# Patient Record
Sex: Female | Born: 1940 | Race: White | Hispanic: No | Marital: Married | Smoking: Never smoker
Health system: Southern US, Community
[De-identification: ages and names within clinical notes are randomized; demographics above are authoritative.]

## PROBLEM LIST (undated history)

## (undated) DIAGNOSIS — R222 Localized swelling, mass and lump, trunk: Secondary | ICD-10-CM

## (undated) DIAGNOSIS — C801 Malignant (primary) neoplasm, unspecified: Secondary | ICD-10-CM

## (undated) DIAGNOSIS — R112 Nausea with vomiting, unspecified: Secondary | ICD-10-CM

## (undated) DIAGNOSIS — I10 Essential (primary) hypertension: Secondary | ICD-10-CM

## (undated) DIAGNOSIS — E785 Hyperlipidemia, unspecified: Secondary | ICD-10-CM

## (undated) DIAGNOSIS — Z9889 Other specified postprocedural states: Secondary | ICD-10-CM

## (undated) DIAGNOSIS — E039 Hypothyroidism, unspecified: Secondary | ICD-10-CM

## (undated) DIAGNOSIS — D229 Melanocytic nevi, unspecified: Secondary | ICD-10-CM

## (undated) DIAGNOSIS — J302 Other seasonal allergic rhinitis: Secondary | ICD-10-CM

## (undated) HISTORY — PX: TUBAL LIGATION: SHX77

## (undated) HISTORY — DX: Hypothyroidism, unspecified: E03.9

## (undated) HISTORY — DX: Localized swelling, mass and lump, trunk: R22.2

## (undated) HISTORY — DX: Hyperlipidemia, unspecified: E78.5

## (undated) HISTORY — DX: Other seasonal allergic rhinitis: J30.2

## (undated) HISTORY — DX: Essential (primary) hypertension: I10

## (undated) HISTORY — PX: OTHER SURGICAL HISTORY: SHX169

---

## 2006-04-07 ENCOUNTER — Other Ambulatory Visit: Admission: RE | Admit: 2006-04-07 | Discharge: 2006-04-07 | Payer: Self-pay | Admitting: Family Medicine

## 2008-04-19 ENCOUNTER — Emergency Department (HOSPITAL_COMMUNITY): Admission: EM | Admit: 2008-04-19 | Discharge: 2008-04-19 | Payer: Self-pay | Admitting: Emergency Medicine

## 2011-03-25 ENCOUNTER — Other Ambulatory Visit: Payer: Self-pay | Admitting: *Deleted

## 2011-03-25 ENCOUNTER — Encounter: Payer: Self-pay | Admitting: Cardiology

## 2011-03-25 ENCOUNTER — Ambulatory Visit (INDEPENDENT_AMBULATORY_CARE_PROVIDER_SITE_OTHER): Payer: Medicare Other | Admitting: Cardiology

## 2011-03-25 VITALS — BP 128/88 | HR 59 | Ht 62.0 in | Wt 129.6 lb

## 2011-03-25 DIAGNOSIS — E7800 Pure hypercholesterolemia, unspecified: Secondary | ICD-10-CM | POA: Insufficient documentation

## 2011-03-25 DIAGNOSIS — R002 Palpitations: Secondary | ICD-10-CM

## 2011-03-25 DIAGNOSIS — E039 Hypothyroidism, unspecified: Secondary | ICD-10-CM

## 2011-03-25 DIAGNOSIS — I10 Essential (primary) hypertension: Secondary | ICD-10-CM

## 2011-03-25 LAB — COMPREHENSIVE METABOLIC PANEL
AST: 25 U/L (ref 0–37)
Albumin: 4.3 g/dL (ref 3.5–5.2)
Alkaline Phosphatase: 86 U/L (ref 39–117)
BUN: 14 mg/dL (ref 6–23)
Creat: 0.99 mg/dL (ref 0.40–1.20)
Glucose, Bld: 80 mg/dL (ref 70–99)
Potassium: 4.3 mEq/L (ref 3.5–5.3)

## 2011-03-25 LAB — LIPID PANEL
Cholesterol: 232 mg/dL — ABNORMAL HIGH (ref 0–200)
HDL: 56 mg/dL
LDL Cholesterol: 149 mg/dL — ABNORMAL HIGH (ref 0–99)
Total CHOL/HDL Ratio: 4.1 ratio
Triglycerides: 133 mg/dL
VLDL: 27 mg/dL (ref 0–40)

## 2011-03-25 MED ORDER — LISINOPRIL 5 MG PO TABS: 5.0000 mg | ORAL_TABLET | Freq: Every day | ORAL | Status: DC

## 2011-03-25 NOTE — Progress Notes (Signed)
Subjective:   Olivia Espinoza is seen today for followup one-year visit. She has occasional palpitations mainly at night her last less than a minute. She's been also simvastatin caused generalized myalgias. She's had hypothyroidism treated and feels considerably better. Overall, she feels better since she retired in February 2011. She doesn't note any palpitations during the day.she's not had any syncope, lightheadedness or dizziness.  No current outpatient prescriptions on file.    Allergies  Allergen Reactions  . Advil Hives  . Ceclor (Cefaclor)   . Motrin (Ibuprofen) Hives    There is no problem list on file for this patient.   History  Smoking status  . Never Smoker   Smokeless tobacco  . Not on file    History  Alcohol Use  . Yes    occassionally    No family history on file.  Review of Systems:   The patient denies any heat or cold intolerance.  No weight gain or weight loss.  The patient denies headaches or blurry vision.  There is no cough or sputum production.  The patient denies dizziness.  There is no hematuria or hematochezia.  The patient denies any muscle aches or arthritis.  The patient denies any rash.  The patient denies frequent falling or instability.  There is no history of depression or anxiety.  All other systems were reviewed and are negative.   Physical Exam:   Weight is 129. Blood pressure as recorded. The head is normocephalic and atraumatic.  Pupils are equally round and reactive to light.  Sclerae nonicteric.  Conjunctiva is clear.  Oropharynx is unremarkable.  There's adequate oral airway.  Neck is supple there are no masses.  Thyroid is not enlarged.  There is no lymphadenopathy.  Lungs are clear.  Chest is symmetric.  Heart shows a regular rate and rhythm.  S1 and S2 are normal.  There is no murmur click or gallop.  Abdomen is soft normal bowel sounds.  There is no organomegaly.  Genital and rectal deferred.  Extremities are without edema.  Peripheral  pulses are adequate.  Neurologically intact.  Full range of motion.  The patient is not depressed.  Skin is warm and dry.   Assessment / Plan:

## 2011-03-25 NOTE — Assessment & Plan Note (Signed)
I doubt this is a real serious arrhythmia. We'll get a Holter monitor. If she is having arrhythmia during the day or worsening arrhythmia than what I suspect, will consider further evaluation. She did have a stress echo a few years ago the documented normal left ventricular function and I don't feel the need to repeat it at this time.

## 2011-03-25 NOTE — Assessment & Plan Note (Signed)
On replaced

## 2011-03-26 ENCOUNTER — Telehealth: Payer: Self-pay | Admitting: *Deleted

## 2011-03-26 NOTE — Telephone Encounter (Signed)
Pt notified of lab results and pt will start Crestor 5 mg 2-3 times weekly.  Pt will have labs in six weeks and will call if any problems develop with low dose Crestor.  Sample provided to pt of Crestor.

## 2011-03-26 NOTE — Telephone Encounter (Signed)
Message copied by Barnetta Hammersmith on Tue Mar 26, 2011 10:02 AM ------      Message from: Norma Fredrickson      Created: Tue Mar 26, 2011  8:30 AM       Does not look like she is on any statin. Would she consider low dose Crestor 5mg  2 to 3 x per week. If so, recheck labs in 6 weeks.

## 2011-04-22 ENCOUNTER — Encounter: Payer: Self-pay | Admitting: Cardiology

## 2011-04-24 ENCOUNTER — Other Ambulatory Visit: Payer: Self-pay | Admitting: *Deleted

## 2011-04-24 DIAGNOSIS — E7800 Pure hypercholesterolemia, unspecified: Secondary | ICD-10-CM

## 2011-05-07 ENCOUNTER — Other Ambulatory Visit: Payer: Self-pay | Admitting: *Deleted

## 2011-09-24 LAB — POCT CARDIAC MARKERS
CKMB, poc: 1.1
Operator id: 277751
Troponin i, poc: <0.05
Troponin i, poc: <0.05

## 2011-09-24 LAB — POCT I-STAT, CHEM 8
BUN: 25 — ABNORMAL HIGH
Calcium, Ion: 1.14
Chloride: 102
Creatinine, Ser: 1.6 — ABNORMAL HIGH
Sodium: 136

## 2011-09-24 LAB — D-DIMER, QUANTITATIVE: D-Dimer, Quant: 0.29

## 2012-04-01 ENCOUNTER — Other Ambulatory Visit: Payer: Self-pay | Admitting: Cardiology

## 2014-02-08 ENCOUNTER — Other Ambulatory Visit: Payer: Self-pay | Admitting: Family Medicine

## 2014-02-08 DIAGNOSIS — R1031 Right lower quadrant pain: Secondary | ICD-10-CM

## 2014-02-11 ENCOUNTER — Ambulatory Visit
Admission: RE | Admit: 2014-02-11 | Discharge: 2014-02-11 | Disposition: A | Discharge status: Home / Self Care | Payer: Medicare PPO | Source: Ambulatory Visit | Attending: Family Medicine | Admitting: Family Medicine

## 2014-02-11 DIAGNOSIS — R1031 Right lower quadrant pain: Secondary | ICD-10-CM

## 2014-02-11 MED ORDER — IOHEXOL 300 MG/ML  SOLN
100.0000 mL | Freq: Once | INTRAMUSCULAR | Status: AC | PRN
  Administered 2014-02-11: 100 mL via INTRAVENOUS

## 2014-05-09 ENCOUNTER — Encounter: Payer: Self-pay | Admitting: Cardiology

## 2014-08-05 DIAGNOSIS — H25813 Combined forms of age-related cataract, bilateral: Secondary | ICD-10-CM | POA: Insufficient documentation

## 2014-08-05 DIAGNOSIS — IMO0002 Reserved for concepts with insufficient information to code with codable children: Secondary | ICD-10-CM | POA: Insufficient documentation

## 2015-05-16 DIAGNOSIS — H2513 Age-related nuclear cataract, bilateral: Secondary | ICD-10-CM | POA: Diagnosis not present

## 2015-05-16 DIAGNOSIS — D3132 Benign neoplasm of left choroid: Secondary | ICD-10-CM | POA: Diagnosis not present

## 2015-06-22 DIAGNOSIS — Z1231 Encounter for screening mammogram for malignant neoplasm of breast: Secondary | ICD-10-CM | POA: Diagnosis not present

## 2015-07-10 DIAGNOSIS — L237 Allergic contact dermatitis due to plants, except food: Secondary | ICD-10-CM | POA: Diagnosis not present

## 2015-08-18 DIAGNOSIS — L570 Actinic keratosis: Secondary | ICD-10-CM | POA: Diagnosis not present

## 2015-08-18 DIAGNOSIS — X32XXXD Exposure to sunlight, subsequent encounter: Secondary | ICD-10-CM | POA: Diagnosis not present

## 2015-08-18 DIAGNOSIS — Z1283 Encounter for screening for malignant neoplasm of skin: Secondary | ICD-10-CM | POA: Diagnosis not present

## 2015-08-24 DIAGNOSIS — J309 Allergic rhinitis, unspecified: Secondary | ICD-10-CM | POA: Diagnosis not present

## 2015-08-24 DIAGNOSIS — J320 Chronic maxillary sinusitis: Secondary | ICD-10-CM | POA: Diagnosis not present

## 2015-09-06 DIAGNOSIS — H52223 Regular astigmatism, bilateral: Secondary | ICD-10-CM | POA: Diagnosis not present

## 2015-09-06 DIAGNOSIS — D3132 Benign neoplasm of left choroid: Secondary | ICD-10-CM | POA: Diagnosis not present

## 2015-09-06 DIAGNOSIS — H524 Presbyopia: Secondary | ICD-10-CM | POA: Diagnosis not present

## 2015-09-06 DIAGNOSIS — H5203 Hypermetropia, bilateral: Secondary | ICD-10-CM | POA: Diagnosis not present

## 2015-09-06 DIAGNOSIS — H40053 Ocular hypertension, bilateral: Secondary | ICD-10-CM | POA: Diagnosis not present

## 2015-09-06 DIAGNOSIS — H2513 Age-related nuclear cataract, bilateral: Secondary | ICD-10-CM | POA: Diagnosis not present

## 2015-10-27 DIAGNOSIS — Z23 Encounter for immunization: Secondary | ICD-10-CM | POA: Diagnosis not present

## 2015-11-07 DIAGNOSIS — R1011 Right upper quadrant pain: Secondary | ICD-10-CM | POA: Diagnosis not present

## 2015-11-07 DIAGNOSIS — Z1389 Encounter for screening for other disorder: Secondary | ICD-10-CM | POA: Diagnosis not present

## 2015-11-07 DIAGNOSIS — I10 Essential (primary) hypertension: Secondary | ICD-10-CM | POA: Diagnosis not present

## 2015-11-07 DIAGNOSIS — E039 Hypothyroidism, unspecified: Secondary | ICD-10-CM | POA: Diagnosis not present

## 2015-11-07 DIAGNOSIS — Z6824 Body mass index (BMI) 24.0-24.9, adult: Secondary | ICD-10-CM | POA: Diagnosis not present

## 2015-11-07 DIAGNOSIS — E784 Other hyperlipidemia: Secondary | ICD-10-CM | POA: Diagnosis not present

## 2015-11-15 DIAGNOSIS — C6932 Malignant neoplasm of left choroid: Secondary | ICD-10-CM | POA: Diagnosis not present

## 2015-11-15 DIAGNOSIS — H2513 Age-related nuclear cataract, bilateral: Secondary | ICD-10-CM | POA: Diagnosis not present

## 2015-11-20 DIAGNOSIS — I10 Essential (primary) hypertension: Secondary | ICD-10-CM | POA: Diagnosis not present

## 2015-11-20 DIAGNOSIS — E7800 Pure hypercholesterolemia, unspecified: Secondary | ICD-10-CM | POA: Diagnosis not present

## 2015-11-20 DIAGNOSIS — C6932 Malignant neoplasm of left choroid: Secondary | ICD-10-CM | POA: Diagnosis not present

## 2015-11-20 DIAGNOSIS — Z79899 Other long term (current) drug therapy: Secondary | ICD-10-CM | POA: Diagnosis not present

## 2015-11-20 DIAGNOSIS — Z886 Allergy status to analgesic agent status: Secondary | ICD-10-CM | POA: Diagnosis not present

## 2015-11-20 DIAGNOSIS — E039 Hypothyroidism, unspecified: Secondary | ICD-10-CM | POA: Diagnosis not present

## 2015-11-20 DIAGNOSIS — Z85820 Personal history of malignant melanoma of skin: Secondary | ICD-10-CM | POA: Diagnosis not present

## 2015-11-20 DIAGNOSIS — Z888 Allergy status to other drugs, medicaments and biological substances status: Secondary | ICD-10-CM | POA: Diagnosis not present

## 2015-11-21 ENCOUNTER — Other Ambulatory Visit (HOSPITAL_COMMUNITY): Payer: Self-pay | Admitting: Endocrinology

## 2015-11-21 DIAGNOSIS — C6932 Malignant neoplasm of left choroid: Secondary | ICD-10-CM

## 2015-11-27 DIAGNOSIS — I491 Atrial premature depolarization: Secondary | ICD-10-CM | POA: Diagnosis not present

## 2015-11-27 DIAGNOSIS — C6930 Malignant neoplasm of unspecified choroid: Secondary | ICD-10-CM | POA: Diagnosis not present

## 2015-11-28 DIAGNOSIS — R222 Localized swelling, mass and lump, trunk: Secondary | ICD-10-CM | POA: Diagnosis not present

## 2015-11-28 DIAGNOSIS — C6930 Malignant neoplasm of unspecified choroid: Secondary | ICD-10-CM | POA: Diagnosis not present

## 2015-11-28 DIAGNOSIS — C6990 Malignant neoplasm of unspecified site of unspecified eye: Secondary | ICD-10-CM | POA: Diagnosis not present

## 2015-11-28 DIAGNOSIS — J984 Other disorders of lung: Secondary | ICD-10-CM | POA: Diagnosis not present

## 2015-12-04 DIAGNOSIS — C6932 Malignant neoplasm of left choroid: Secondary | ICD-10-CM | POA: Diagnosis not present

## 2015-12-04 DIAGNOSIS — I160 Hypertensive urgency: Secondary | ICD-10-CM | POA: Diagnosis not present

## 2015-12-04 DIAGNOSIS — E039 Hypothyroidism, unspecified: Secondary | ICD-10-CM | POA: Diagnosis not present

## 2015-12-04 DIAGNOSIS — I10 Essential (primary) hypertension: Secondary | ICD-10-CM | POA: Diagnosis not present

## 2015-12-04 DIAGNOSIS — C6930 Malignant neoplasm of unspecified choroid: Secondary | ICD-10-CM | POA: Diagnosis not present

## 2015-12-04 DIAGNOSIS — E7800 Pure hypercholesterolemia, unspecified: Secondary | ICD-10-CM | POA: Diagnosis not present

## 2015-12-04 DIAGNOSIS — Z8249 Family history of ischemic heart disease and other diseases of the circulatory system: Secondary | ICD-10-CM | POA: Diagnosis not present

## 2015-12-04 DIAGNOSIS — E785 Hyperlipidemia, unspecified: Secondary | ICD-10-CM | POA: Diagnosis not present

## 2015-12-04 DIAGNOSIS — Z823 Family history of stroke: Secondary | ICD-10-CM | POA: Diagnosis not present

## 2015-12-12 ENCOUNTER — Other Ambulatory Visit (HOSPITAL_COMMUNITY): Payer: Self-pay | Admitting: Endocrinology

## 2015-12-14 DIAGNOSIS — C6930 Malignant neoplasm of unspecified choroid: Secondary | ICD-10-CM | POA: Insufficient documentation

## 2016-01-15 ENCOUNTER — Other Ambulatory Visit (HOSPITAL_COMMUNITY): Payer: Self-pay | Admitting: Interventional Radiology

## 2016-01-15 ENCOUNTER — Inpatient Hospital Stay
Admission: RE | Admit: 2016-01-15 | Discharge: 2016-01-15 | Disposition: A | Discharge status: Home / Self Care | Payer: Self-pay | Source: Ambulatory Visit | Attending: Interventional Radiology | Admitting: Interventional Radiology

## 2016-01-15 DIAGNOSIS — C801 Malignant (primary) neoplasm, unspecified: Secondary | ICD-10-CM

## 2016-01-16 ENCOUNTER — Other Ambulatory Visit (HOSPITAL_COMMUNITY): Payer: Self-pay | Admitting: Interventional Radiology

## 2016-01-16 ENCOUNTER — Inpatient Hospital Stay
Admission: RE | Admit: 2016-01-16 | Discharge: 2016-01-16 | Disposition: A | Discharge status: Home / Self Care | Payer: Self-pay | Source: Ambulatory Visit | Attending: Interventional Radiology | Admitting: Interventional Radiology

## 2016-01-16 DIAGNOSIS — C801 Malignant (primary) neoplasm, unspecified: Secondary | ICD-10-CM

## 2016-01-23 ENCOUNTER — Other Ambulatory Visit: Payer: Self-pay

## 2016-01-23 ENCOUNTER — Other Ambulatory Visit: Payer: Self-pay | Admitting: Endocrinology

## 2016-01-23 DIAGNOSIS — C439 Malignant melanoma of skin, unspecified: Secondary | ICD-10-CM

## 2016-01-23 DIAGNOSIS — G9589 Other specified diseases of spinal cord: Secondary | ICD-10-CM

## 2016-01-24 ENCOUNTER — Other Ambulatory Visit: Payer: Self-pay

## 2016-01-24 ENCOUNTER — Ambulatory Visit
Admission: RE | Admit: 2016-01-24 | Discharge: 2016-01-24 | Disposition: A | Discharge status: Home / Self Care | Payer: Medicare Other | Source: Ambulatory Visit | Attending: Endocrinology | Admitting: Endocrinology

## 2016-01-24 DIAGNOSIS — C439 Malignant melanoma of skin, unspecified: Secondary | ICD-10-CM

## 2016-01-24 DIAGNOSIS — G9589 Other specified diseases of spinal cord: Secondary | ICD-10-CM

## 2016-01-26 ENCOUNTER — Ambulatory Visit
Admission: RE | Admit: 2016-01-26 | Discharge: 2016-01-26 | Disposition: A | Discharge status: Home / Self Care | Payer: Medicare Other | Source: Ambulatory Visit | Attending: Endocrinology | Admitting: Endocrinology

## 2016-01-26 MED ORDER — GADOBENATE DIMEGLUMINE 529 MG/ML IV SOLN
10.0000 mL | Freq: Once | INTRAVENOUS | Status: AC | PRN
  Administered 2016-01-26: 10 mL via INTRAVENOUS

## 2016-02-05 ENCOUNTER — Other Ambulatory Visit (HOSPITAL_COMMUNITY): Payer: Self-pay | Admitting: Endocrinology

## 2016-02-05 DIAGNOSIS — R222 Localized swelling, mass and lump, trunk: Secondary | ICD-10-CM

## 2016-02-14 ENCOUNTER — Encounter (HOSPITAL_COMMUNITY)
Admission: RE | Admit: 2016-02-14 | Discharge: 2016-02-14 | Disposition: A | Discharge status: Home / Self Care | Payer: Medicare Other | Source: Ambulatory Visit | Attending: Endocrinology | Admitting: Endocrinology

## 2016-02-14 DIAGNOSIS — R29818 Other symptoms and signs involving the nervous system: Secondary | ICD-10-CM | POA: Diagnosis not present

## 2016-02-14 DIAGNOSIS — R222 Localized swelling, mass and lump, trunk: Secondary | ICD-10-CM

## 2016-02-14 LAB — GLUCOSE, CAPILLARY: GLUCOSE-CAPILLARY: 81 mg/dL (ref 65–99)

## 2016-02-14 MED ORDER — FLUDEOXYGLUCOSE F - 18 (FDG) INJECTION
6.9000 | Freq: Once | INTRAVENOUS | Status: AC | PRN
  Administered 2016-02-14: 6.9 via INTRAVENOUS

## 2016-02-23 ENCOUNTER — Other Ambulatory Visit: Payer: Self-pay | Admitting: Oncology

## 2016-02-23 NOTE — Progress Notes (Unsigned)
Dr. 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

## 2016-02-25 ENCOUNTER — Other Ambulatory Visit: Payer: Self-pay | Admitting: Oncology

## 2016-02-27 ENCOUNTER — Other Ambulatory Visit: Payer: Self-pay | Admitting: Oncology

## 2016-02-27 ENCOUNTER — Telehealth: Payer: Self-pay | Admitting: *Deleted

## 2016-02-27 ENCOUNTER — Other Ambulatory Visit: Payer: Self-pay | Admitting: *Deleted

## 2016-02-27 ENCOUNTER — Encounter: Payer: Self-pay | Admitting: Oncology

## 2016-02-27 NOTE — Telephone Encounter (Signed)
This RN spoke with DJ RN at Dr [facility] Crown office regarding requested appointment per MD to MD communication on Friday 2/24.  Dr Jannifer Rodney per above communication stated he could see pt this Friday 3/3.  DJ states per her communication with pt - pt is requesting to be seen by Dr Marin Olp.  This RN informed DJ- above request will be given to our HIM department to schedule appointment with Dr Marin Olp.  Detailed message left per need for appointment per above.  This note will be forwarded to HIM and MD for communication.

## 2016-02-28 ENCOUNTER — Other Ambulatory Visit: Payer: Self-pay

## 2016-02-28 DIAGNOSIS — C6990 Malignant neoplasm of unspecified site of unspecified eye: Secondary | ICD-10-CM

## 2016-02-29 ENCOUNTER — Ambulatory Visit (HOSPITAL_BASED_OUTPATIENT_CLINIC_OR_DEPARTMENT_OTHER): Payer: Medicare Other | Admitting: Hematology & Oncology

## 2016-02-29 ENCOUNTER — Ambulatory Visit: Payer: Medicare Other

## 2016-02-29 ENCOUNTER — Encounter: Payer: Self-pay | Admitting: Hematology & Oncology

## 2016-02-29 ENCOUNTER — Other Ambulatory Visit (HOSPITAL_BASED_OUTPATIENT_CLINIC_OR_DEPARTMENT_OTHER): Payer: Medicare Other

## 2016-02-29 VITALS — BP 150/100 | HR 76 | Temp 98.2°F | Resp 16 | Ht 62.0 in | Wt 131.0 lb

## 2016-02-29 DIAGNOSIS — C6990 Malignant neoplasm of unspecified site of unspecified eye: Secondary | ICD-10-CM

## 2016-02-29 DIAGNOSIS — C6932 Malignant neoplasm of left choroid: Secondary | ICD-10-CM

## 2016-02-29 LAB — COMPREHENSIVE METABOLIC PANEL (CC13)
A/G RATIO: 1.6 (ref 1.1–2.5)
ALT: 21 IU/L (ref 0–32)
AST: 25 IU/L (ref 0–40)
Albumin, Serum: 4.3 g/dL (ref 3.5–4.8)
Alkaline Phosphatase, S: 94 IU/L (ref 39–117)
BILIRUBIN TOTAL: 0.4 mg/dL (ref 0.0–1.2)
BUN/Creatinine Ratio: 21 (ref 11–26)
BUN: 19 mg/dL (ref 8–27)
CHLORIDE: 104 mmol/L (ref 96–106)
Calcium, Ser: 9.2 mg/dL (ref 8.7–10.3)
Carbon Dioxide, Total: 24 mmol/L (ref 18–29)
Creatinine, Ser: 0.9 mg/dL (ref 0.57–1.00)
GFR calc non Af Amer: 64 mL/min/{1.73_m2} (ref >59)
GFR, EST AFRICAN AMERICAN: 73 mL/min/{1.73_m2} (ref >59)
GLOBULIN, TOTAL: 2.7 g/dL (ref 1.5–4.5)
Glucose: 136 mg/dL — ABNORMAL HIGH (ref 65–99)
POTASSIUM: 3.7 mmol/L (ref 3.5–5.2)
SODIUM: 135 mmol/L (ref 134–144)
Total Protein: 7 g/dL (ref 6.0–8.5)

## 2016-02-29 LAB — CBC WITH DIFFERENTIAL (CANCER CENTER ONLY)
BASO#: 0 10*3/uL (ref 0.0–0.2)
BASO%: 0.4 % (ref 0.0–2.0)
EOS%: 3 % (ref 0.0–7.0)
Eosinophils Absolute: 0.3 10*3/uL (ref 0.0–0.5)
HEMATOCRIT: 41 % (ref 34.8–46.6)
HGB: 14.1 g/dL (ref 11.6–15.9)
LYMPH#: 2.3 10*3/uL (ref 0.9–3.3)
LYMPH%: 23.7 % (ref 14.0–48.0)
MCH: 31.8 pg (ref 26.0–34.0)
MCHC: 34.4 g/dL (ref 32.0–36.0)
MCV: 92 fL (ref 81–101)
MONO#: 0.8 10*3/uL (ref 0.1–0.9)
MONO%: 7.6 % (ref 0.0–13.0)
NEUT#: 6.4 10*3/uL (ref 1.5–6.5)
NEUT%: 65.3 % (ref 39.6–80.0)
Platelets: 361 10*3/uL (ref 145–400)
RBC: 4.44 10*6/uL (ref 3.70–5.32)
RDW: 12.9 % (ref 11.1–15.7)
WBC: 9.8 10*3/uL (ref 3.9–10.0)

## 2016-02-29 LAB — CHCC SATELLITE - SMEAR

## 2016-02-29 NOTE — Progress Notes (Signed)
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 OTHER    New patient  : I may have a mass in my chest.  HPI: Olivia Espinoza is a very charming 75 year old white female. She the very interesting history. She began to note some visual issues a couple years ago. She finally was able to have a formal evaluation. She was then referred to [facility]. 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 I think that the thickness was 2.76 mm. I don't think she ever had a biopsy done.  She underwent a radioactive I-125 plaque brachytherapy. This was done from 12/04/2015 2 12/08/2015. She tolerated this well. She began to have better vision in the left eye.  I think it December, she had a chest x-ray. This apparently showed a paratracheal mass on the right. A CT scan was done which confirmed this. However, is not clear as to whether or not this was malignant. She then underwent an MRI. This was done on January 27 area and the MRI showed a 2.5 x 3.6 x 4.2 cm mass adjacent to the right T1 haven't T2 level. It is not appear to be associated with the right upper thoracic neural foramina. There is no associated bony invasion or marrow edema. There is no vascular involvement. It appears to be in the superior extrapleural space.  She then had a PET scan done. This is done February 15. This showed mild hypermetabolism of the mass. No abnormalities were noted elsewhere.  It was felt that she needed to be seen by oncology.  She actually just came to her office today hoping to be seen. I was able to work her in.  She has a very good performance status. She is quite active. She's had no pain. She's had no cough. She's had no nausea vomiting. She's had no change in bowel or bladder habits.  She is up-to-date on her mammograms.  She does not smoke. She used to be a Radio producer. There is no occupational exposures.  She's had no hoarseness.  There is no  headache.  Overall, her performance status is ECOG 0.   No past medical history on file.:  No past surgical history on file.:   Current outpatient prescriptions:  .  atorvastatin (LIPITOR) 10 MG tablet, , Disp: , Rfl:  .  levothyroxine (SYNTHROID) 75 MCG tablet, Take by mouth., Disp: , Rfl:  .  lisinopril (PRINIVIL,ZESTRIL) 5 MG tablet, TAKE ONE TABLET BY MOUTH ONE TIME DAILY, Disp: 30 tablet, Rfl: 0:  :  Allergies  Allergen Reactions  . Ceclor [Cefaclor]   . Ibuprofen Hives  . Motrin [Ibuprofen] Hives  :  No family history on file.:  Social History   Social History  . Marital Status: Married    Spouse Name: N/A  . Number of Children: N/A  . Years of Education: N/A   Occupational History  . Not on file.   Social History Main Topics  . Smoking status: Never Smoker   . Smokeless tobacco: Not on file  . Alcohol Use: 0.0 oz/week    0 Standard drinks or equivalent per week     Comment: occassionally  . Drug Use: No  . Sexual Activity: Not on file   Other Topics Concern  . Not on file   Social History Narrative  :  Pertinent items are noted in HPI.  Exam: @IPVITALS @  fairly well-developed and well-nourished white female in  no obvious distress. Vital signs show a temperature of 98.2. Pulse 76. Blood pressure 150/100. Weight is under 31 pounds. Head and neck exam shows no ocular or oral lesions. There are no palpable cervical or supraclavicular lymph nodes. Thyroid is nonpalpable. Lungs are clear. Cardiac exam regular rate and rhythm with no murmurs, rubs or bruits. Abdomen is soft. She has good bowel sounds. There is no fluid wave. There is no palpable liver or spleen tip. Back exam shows no tenderness over the spine, ribs or hips. External he shows no clubbing, cyanosis or edema. Neurological exam shows no focal neurological deficits. Skin exam shows no rashes, ecchymoses or petechia. There are no suspicious hyperpigmented lesions.    Recent Labs  02/29/16 1443   WBC 9.8  HGB 14.1  HCT 41.0  PLT 361   No results for input(s): NA, K, CL, CO2, GLUCOSE, BUN, CREATININE, CALCIUM in the last 72 hours.  Blood smear review:  None  Pathology: None     Assessment and Plan:  Olivia Espinoza is a very charming 75 year old white female. She has an ocular melanoma of the left eye. This was treated with radiation therapy with a I-125 plaque.  I really have to believe that this right paratracheal mass is not associated with this ocular melanoma. This would be a very unusual site for metastatic disease for ocular melanoma.  The uptake on PET scan certainly is not impressive. I wonder if this is some type of benign fibrous-type tumor of the pleura.    Unfortunately, we are going to have to get a biopsy. I don't think we have any other choice. I will speak with thoracic surgery. I think they would be to wants to want to do this procedure. Is possible that this mask be removed totally.  I spoke with Olivia Espinoza for about our. I try to reassure her that I did not think that this was related to her ocular melanoma.   Personally, I do not think this will be malignant.  We'll try to take care of a lot of is over the phone. I'm sure that next time that I will see her will be in the hospital when she has her surgery.  She is very nice. She is fun to talk to. She is a very good attitude.

## 2016-03-01 ENCOUNTER — Ambulatory Visit: Payer: Self-pay | Admitting: Oncology

## 2016-03-01 ENCOUNTER — Encounter: Payer: Self-pay | Admitting: Hematology & Oncology

## 2016-03-01 ENCOUNTER — Other Ambulatory Visit: Payer: Self-pay

## 2016-03-01 LAB — LACTATE DEHYDROGENASE: LDH: 191 U/L (ref 125–245)

## 2016-03-01 LAB — PREALBUMIN: PREALBUMIN: 24 mg/dL (ref 9–32)

## 2016-03-04 ENCOUNTER — Other Ambulatory Visit: Payer: Self-pay | Admitting: Hematology & Oncology

## 2016-03-07 ENCOUNTER — Other Ambulatory Visit: Payer: Self-pay

## 2016-03-07 ENCOUNTER — Other Ambulatory Visit: Payer: Self-pay | Admitting: *Deleted

## 2016-03-07 ENCOUNTER — Institutional Professional Consult (permissible substitution) (INDEPENDENT_AMBULATORY_CARE_PROVIDER_SITE_OTHER): Payer: Medicare Other | Admitting: Thoracic Surgery (Cardiothoracic Vascular Surgery)

## 2016-03-07 VITALS — BP 150/60 | HR 72 | Resp 16 | Ht 62.0 in | Wt 132.0 lb

## 2016-03-07 DIAGNOSIS — R222 Localized swelling, mass and lump, trunk: Secondary | ICD-10-CM

## 2016-03-07 DIAGNOSIS — J948 Other specified pleural conditions: Secondary | ICD-10-CM

## 2016-03-07 DIAGNOSIS — R918 Other nonspecific abnormal finding of lung field: Secondary | ICD-10-CM

## 2016-03-07 NOTE — Progress Notes (Signed)
PCP is No primary care provider on file. Referring Provider is Marin Olp, Rudell Cobb, MD  Chief Complaint  Patient presents with  . 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  Mrs. Swartzfager recently had a melanoma of the left eye treated with an I-125 patch. A CT of the chest and abdomen was done to rule out any evidence of melanoma elsewhere. A mass was noted in the right thoracic inlet. An MR was done which showed the mass to be 4.2 cm. The mass did not involve the neural foramina. It did abut the T1 and T2 costovertebral junction. There is no bony invasion. A PET CT was done which showed the mass was mildly hypermetabolic with an SUV of 3.9.  [name] has been feeling well. She denies any chest pain, pressure, or tightness. She has had a productive cough and is currently taking medication for sinus infection. She has not had any significant shortness of breath or wheezing. She denies any weight loss. She is retired Radio producer. She remains relatively active although she does not exercise on a regular basis.  Zubrod Score: At the time of surgery this patient's most appropriate activity status/level should be described as: [x]     0    Normal activity, no symptoms []     1    Restricted in physical strenuous activity but ambulatory, able to do out light work []     2    Ambulatory and capable of self care, unable to do work activities, up and about >50 % of waking hours                              []     3    Only limited self care, in bed greater than 50% of waking hours []     4    Completely disabled, no self care, confined to bed or chair []     5    Moribund    Past Medical History  Diagnosis Date  . Hypertension   . Hyperlipemia   . Hypothyroidism   . Seasonal allergies    . Paraspinal mass     Past Surgical History  Procedure Laterality Date  . Btl miscarrage      Family History  Problem Relation Age of Onset  . CVA Father     Social History Social History  Substance Use Topics  . Smoking status: Never Smoker   . Smokeless tobacco: None  . Alcohol Use: 0.0 oz/week    0 Standard drinks or equivalent per week     Comment: occassionally    Current Outpatient Prescriptions  Medication Sig Dispense Refill  . amoxicillin (AMOXIL) 500 MG capsule TK 1 C PO TID UNTIL GONE  6  . atorvastatin (LIPITOR) 10 MG tablet 10 mg. TAKES TWICE A WEEK    . levothyroxine (SYNTHROID) 75 MCG tablet Take by mouth daily before breakfast.     . lisinopril (PRINIVIL,ZESTRIL) 5 MG tablet TAKE ONE TABLET BY MOUTH ONE TIME DAILY 30 tablet 0   No current facility-administered medications for this visit.    Allergies  Allergen Reactions  . Ceclor [Cefaclor]   . Ibuprofen Hives  . Motrin [Ibuprofen] Hives    Review of Systems  Constitutional: Negative for fever, chills, activity change, appetite change and unexpected weight change.  HENT: Positive for congestion and sinus pressure. Negative for trouble swallowing and voice change.   Eyes: Positive for visual disturbance.       Melanoma of left eye  Respiratory: Positive for cough. Negative for shortness of breath and wheezing.   Cardiovascular: Negative for chest pain, palpitations and leg swelling.  Gastrointestinal: Negative for abdominal pain and blood in stool.  Endocrine:       Hypothyroid  Musculoskeletal: Negative for neck pain and neck stiffness.  Neurological: Negative for dizziness, seizures, syncope and headaches.  Hematological: Negative for adenopathy. Bruises/bleeds easily (Bruises easily no history of excessive bleeding).  Psychiatric/Behavioral: The patient is nervous/anxious (Anxious about tumor).     BP 150/60 mmHg  Pulse 72  Resp 16  Ht 5\' 2"  (1.575 m)  Wt 132 lb (59.875 kg)  BMI 24.14  kg/m2  SpO2 98% Physical Exam  Constitutional: She is oriented to person, place, and time. She appears well-developed and well-nourished. No distress.  HENT:  Head: Normocephalic and atraumatic.  Eyes: Conjunctivae and EOM are normal. No scleral icterus.  Neck: Neck supple. No thyromegaly present.  Fullness in right supraclavicular area but no definite mass  Cardiovascular: Normal rate, regular rhythm, normal heart sounds and intact distal pulses.  Exam reveals no gallop and no friction rub.   No murmur heard. Pulmonary/Chest: Effort normal and breath sounds normal. She has no wheezes. She has no rales.  Abdominal: Soft. There is no tenderness.  Musculoskeletal: Normal range of motion. She exhibits no edema.  Lymphadenopathy:    She has no cervical adenopathy.  Neurological: She is alert and oriented to person, place, and time. A cranial nerve deficit is present.  Motor intact  Skin: Skin is warm and dry.  Vitals reviewed.    Diagnostic Tests:  MRI THORACIC SPINE WITHOUT AND WITH CONTRAST  TECHNIQUE: Multiplanar and multiecho pulse sequences of the thoracic spine were obtained without and with intravenous contrast.  CONTRAST: 54mL MULTIHANCE GADOBENATE DIMEGLUMINE 529 MG/ML IV SOLN  COMPARISON: [facility] chest CT 11/28/2015, [facility] chest radiographs 11/28/2015. [facility] semi upright portable chest radiograph 4219.  FINDINGS: Limited sagittal imaging of the cervical spine remarkable for mild degenerative appearing spondylolisthesis at C5-C6. Visible cervical spine bone marrow signal appears normal.  The oval right paraspinal mass is re- demonstrated adjacent to the right anterolateral T1-T2 level. This encompasses 25 x 36 x 42 mm, and intensely enhances following contrast. Maximum dimension was approximately 37 mm in November. It does not appear to be intimately associated with the right upper thoracic neural foramina, but rather is nearest  the right T1 and T2 costovertebral junctions. No associated bone invasion or adjacent marrow edema is identified. The lesion is immediately deep to the right subclavian artery at the level of the right vertebral artery origin (series 11, image 9). The right V1 segment of the vertebral artery is mildly displaced due to the mass. The lesion is immediately lateral to the right wall of the trachea at the thoracic inlet. The mass appears to be in the superior extrapleural space. The right lung apex otherwise appears normal. There is no surrounding soft tissue inflammation.  Underlying thoracic vertebral height, alignment, and marrow signal is normal. No marrow edema or evidence of acute osseous abnormality. No abnormal thoracic spine enhancement. The other thoracic paraspinal soft tissues are within normal limits.  Normal thoracic spinal canal patency. Spinal cord signal is within normal  limits at all visualized levels. No dural thickening or enhancement. No abnormal intradural enhancement. The conus medullaris is not included, occurs below the T11-T12 level.  Other visualized thoracic viscera appear within normal limits.  IMPRESSION: 1. Solitary 4.2 cm intensely enhancing right thoracic inlet paraspinal mass. This appears mildly enlarged since November. Also, it is not intimately associated with the upper thoracic neural foramina, arguing against a chronic nerve sheath tumor. This abuts the right T1 and T2 costovertebral junctions, but there is no associated bone invasion or marrow edema. No surrounding inflammation. Note the adjacent right right subclavian and proximal vertebral arteries. 2. Otherwise negative thoracic spine.   Electronically Signed  By: Genevie Ann M.D.  On: 01/26/2016 12:16 NUCLEAR MEDICINE PET WHOLE BODY  TECHNIQUE: 6.9 mCi F-18 FDG was injected intravenously. Full-ring PET imaging was performed from the vertex to the feet after the radiotracer. CT data  was obtained and used for attenuation correction and anatomic localization.  FASTING BLOOD GLUCOSE: Value: 81 mg/dl  COMPARISON: Thoracic spine MRI of 01/26/2016. Outside CT of the chest of 11/28/2015. Abdominal pelvic CT of 02/11/2014.  FINDINGS: HEAD/NECK  No areas of abnormal hypermetabolism.  CHEST  Mild hypermetabolism corresponding to the oval mass centered about the superior medial aspect of the right pleural or extrapleural space, intimately associated with the first and second right ribs. This measures 2.8 x 4.2 cm and a S.U.V. max of 3.9 on image 76/series 4. 3.6 x 2.7 cm on 11/28/2015, suggesting mild interval enlargement. Relatively well-circumscribed, without surrounding invasion.  No areas of abnormal thoracic nodal hypermetabolism. No pulmonary parenchymal hypermetabolism.  ABDOMEN/PELVIS  No areas of abnormal hypermetabolism.  SKELETON  No abnormal marrow activity. No evidence of hypermetabolic skin lesions.  CT IMAGES PERFORMED FOR ATTENUATION CORRECTION  No cervical adenopathy.  Normal heart size, without pericardial effusion. Multivessel coronary artery atherosclerosis. Clear lungs. Advanced abdominal aortic atherosclerosis. Pelvic floor laxity.  IMPRESSION: 1. Mild hypermetabolism, corresponding to the well-circumscribed lesion within the superomedial aspect of the right hemi thorax. Although metastatic disease cannot be entirely excluded, given relatively well-circumscribed appearance, and absence of metastasis elsewhere, alternate etiologies including fibrous tumor of the pleura and atypical appearance of extramedullary hematopoiesis are considerations. Tissue sampling should be considered. 2. Atherosclerosis, including within the coronary arteries.   Electronically Signed  By: Abigail Miyamoto M.D.  On: 02/14/2016 15:14  I personally reviewed the CT, MRI, and PET/CT and concur with the findings as noted  above.  Impression: Mrs. Valladares is a 75 year old woman who was found to have a 2.8 x 4.2 cm mass in the right thoracic inlet. This is mildly hypermetabolic by PET with an SUV of 3.9. Is relatively low for a mass of this size. The mass appears well-circumscribed and there is no evidence of invasion into surrounding structures. I think it's unlikely that this is related to her very small ocular melanoma. Most likely this is a benign tumor of some type.  Masses not easily palpable so I don't think an incisional biopsy will be particular helpful as opposed to going ahead and resecting the mass, as the exposure will be similar in either case. Radiology was reluctant to attempt to needle the mass.  We discussed the options of continued radiographic observation versus surgical resection. I discussed the location of the mass with the proximity of the subclavian, vertebral, and carotid arteries, the jugular vein, the nerve roots, the phrenic and recurrent laryngeal nerves. She understands that any of these structures could potentially be injured with surgical resection. She  also understands that if the mass were to continue to grow that these structures could be compromised by it.  She strongly favors surgical resection.  I think this would best be removed from a supraclavicular approach. It is high enough that I don't think removal of a portion of the clavicle or a sternotomy would be necessary. I described the proposed operation to her in detail including the need for general anesthesia, the incisions to be used, the expected hospital stay, and the overall recovery. I reviewed the indications, risks, benefits, and alternatives. She understands the risk include, but not limited to death, MI, DVT, PE, bleeding, possible need for transfusion, infection, stroke, impaired function of right upper extremity, hoarseness, paralyzed diaphragm, as well as the possibility of other unforeseeable complications.  She accepts  the risks and wishes to proceed.  Plan:  Resection of right thoracic inlet mass via supraclavicular approach on Monday, 03/25/2016   Melrose Nakayama, MD [facility] [phone]

## 2016-03-12 ENCOUNTER — Ambulatory Visit (INDEPENDENT_AMBULATORY_CARE_PROVIDER_SITE_OTHER): Payer: Medicare Other | Admitting: Thoracic Surgery (Cardiothoracic Vascular Surgery)

## 2016-03-12 ENCOUNTER — Encounter: Payer: Self-pay | Admitting: Thoracic Surgery (Cardiothoracic Vascular Surgery)

## 2016-03-12 VITALS — BP 152/90 | HR 72 | Resp 16 | Ht 62.0 in | Wt 132.0 lb

## 2016-03-12 DIAGNOSIS — J948 Other specified pleural conditions: Secondary | ICD-10-CM

## 2016-03-12 DIAGNOSIS — R222 Localized swelling, mass and lump, trunk: Secondary | ICD-10-CM | POA: Diagnosis not present

## 2016-03-12 NOTE — Progress Notes (Signed)
      [address]       [place] [address]             [phone]       HPI: Olivia Espinoza turns today with questions regarding her upcoming surgery to remove a right thoracic inlet mass.  She had questions about the anesthesia. She has concerns about a medication she received [facility] which causes nausea. She will address those concerns with the anesthesiologist.  We again reviewed the CT and MRI and showed the surrounding structures entering) proximity of the mass and discussed the possibility of injury to 1 or more those structures and the potential aftereffects should that occur. She is well versed in the potential risks which include but are not limited to death, MI, DVT, PE, bleeding, possible need for transfusion, stroke, infection, hoarseness, paralysis diaphragm, as well as the possibility of other unforeseeable complications.  She wanted to let us know that she had an adverse reaction to Levaquin which caused severe abdominal pain after 1 dose.  She gets a rash with Ceclor, but can take amoxicillin without difficulty. She does not tolerate Augmentin due to gastrointestinal side effects.  He also had concerns because of hypertension while at [facility] after having the radiation patch to her eye.  Past Medical History  Diagnosis Date  . Hypertension   . Hyperlipemia   . Hypothyroidism   . Seasonal allergies   . Paraspinal mass     Current Outpatient Prescriptions  Medication Sig Dispense Refill  . amoxicillin (AMOXIL) 500 MG capsule TK 1 C PO TID UNTIL GONE  6  . atorvastatin (LIPITOR) 10 MG tablet 10 mg. TAKES TWICE A WEEK    . levothyroxine (SYNTHROID) 75 MCG tablet Take by mouth daily before breakfast.     . lisinopril (PRINIVIL,ZESTRIL) 5 MG tablet TAKE ONE TABLET BY MOUTH ONE TIME DAILY 30 tablet 0   No current facility-administered medications for this visit.    Physical Exam BP 152/90 mmHg  Pulse 72  Resp 16  Ht 5\' 2"  (1.575 m)  Wt 132 lb  (59.875 kg)  BMI 24.14 kg/m2  SpO2 98%  Diagnostic Tests: I personally reviewed the CT and MRI and showed those images to the patient and her friend who is a nurse   Impression: Right thoracic inlet mass  Plan: She is comfortable with proceeding with surgical resection via right supraclavicular approach. We'll proceed as scheduled   I spent 10 minutes face-to-face with Olivia Espinoza during this visit, all the time was spent in consultation, answering all questions of asthma ability Melrose Nakayama, MD [facility] [phone]

## 2016-03-21 ENCOUNTER — Encounter (HOSPITAL_COMMUNITY): Payer: Self-pay

## 2016-03-21 ENCOUNTER — Encounter (HOSPITAL_COMMUNITY)
Admission: RE | Admit: 2016-03-21 | Discharge: 2016-03-21 | Disposition: A | Discharge status: Home / Self Care | Payer: Medicare Other | Source: Ambulatory Visit | Attending: Thoracic Surgery (Cardiothoracic Vascular Surgery) | Admitting: Thoracic Surgery (Cardiothoracic Vascular Surgery)

## 2016-03-21 ENCOUNTER — Other Ambulatory Visit: Payer: Self-pay

## 2016-03-21 VITALS — BP 140/76 | HR 80 | Temp 98.0°F | Resp 18 | Ht 62.0 in | Wt 131.0 lb

## 2016-03-21 DIAGNOSIS — R918 Other nonspecific abnormal finding of lung field: Secondary | ICD-10-CM | POA: Diagnosis not present

## 2016-03-21 DIAGNOSIS — Z0183 Encounter for blood typing: Secondary | ICD-10-CM | POA: Insufficient documentation

## 2016-03-21 DIAGNOSIS — Z01818 Encounter for other preprocedural examination: Secondary | ICD-10-CM | POA: Insufficient documentation

## 2016-03-21 DIAGNOSIS — Z01812 Encounter for preprocedural laboratory examination: Secondary | ICD-10-CM | POA: Insufficient documentation

## 2016-03-21 HISTORY — DX: Other specified postprocedural states: Z98.890

## 2016-03-21 HISTORY — DX: Melanocytic nevi, unspecified: D22.9

## 2016-03-21 HISTORY — DX: Nausea with vomiting, unspecified: R11.2

## 2016-03-21 LAB — BLOOD GAS, ARTERIAL
Acid-Base Excess: 2.6 mmol/L — ABNORMAL HIGH (ref 0.0–2.0)
BICARBONATE: 26.3 meq/L — AB (ref 20.0–24.0)
Drawn by: 42180
FIO2: 0.21
O2 SAT: 96.9 %
PATIENT TEMPERATURE: 98.6
PO2 ART: 91.6 mmHg (ref 80.0–100.0)
TCO2: 27.5 mmol/L (ref 0–100)
pCO2 arterial: 38.1 mmHg (ref 35.0–45.0)
pH, Arterial: 7.453 — ABNORMAL HIGH (ref 7.350–7.450)

## 2016-03-21 LAB — COMPREHENSIVE METABOLIC PANEL
ALK PHOS: 85 U/L (ref 38–126)
ALT: 24 U/L (ref 14–54)
AST: 30 U/L (ref 15–41)
Albumin: 3.8 g/dL (ref 3.5–5.0)
Anion gap: 9 (ref 5–15)
BILIRUBIN TOTAL: 0.6 mg/dL (ref 0.3–1.2)
BUN: 15 mg/dL (ref 6–20)
CALCIUM: 9.6 mg/dL (ref 8.9–10.3)
CO2: 24 mmol/L (ref 22–32)
CREATININE: 0.93 mg/dL (ref 0.44–1.00)
Chloride: 106 mmol/L (ref 101–111)
GFR, EST NON AFRICAN AMERICAN: 60 mL/min — AB (ref >60)
Glucose, Bld: 101 mg/dL — ABNORMAL HIGH (ref 65–99)
Potassium: 4 mmol/L (ref 3.5–5.1)
Sodium: 139 mmol/L (ref 135–145)
Total Protein: 6.8 g/dL (ref 6.5–8.1)

## 2016-03-21 LAB — CBC
HCT: 41.1 % (ref 36.0–46.0)
HEMOGLOBIN: 13.3 g/dL (ref 12.0–15.0)
MCH: 30.1 pg (ref 26.0–34.0)
MCHC: 32.4 g/dL (ref 30.0–36.0)
MCV: 93 fL (ref 78.0–100.0)
Platelets: 307 10*3/uL (ref 150–400)
RBC: 4.42 MIL/uL (ref 3.87–5.11)
RDW: 12.9 % (ref 11.5–15.5)
WBC: 9.7 10*3/uL (ref 4.0–10.5)

## 2016-03-21 LAB — SURGICAL PCR SCREEN
MRSA, PCR: NEGATIVE
Staphylococcus aureus: NEGATIVE

## 2016-03-21 LAB — URINALYSIS, ROUTINE W REFLEX MICROSCOPIC
Bilirubin Urine: NEGATIVE
GLUCOSE, UA: NEGATIVE mg/dL
Hgb urine dipstick: NEGATIVE
KETONES UR: NEGATIVE mg/dL
LEUKOCYTES UA: NEGATIVE
NITRITE: NEGATIVE
PH: 6.5 (ref 5.0–8.0)
PROTEIN: NEGATIVE mg/dL
Specific Gravity, Urine: 1.004 — ABNORMAL LOW (ref 1.005–1.030)

## 2016-03-21 LAB — ABO/RH: ABO/RH(D): A POS

## 2016-03-21 LAB — PROTIME-INR
INR: 1 (ref 0.00–1.49)
PROTHROMBIN TIME: 13.4 s (ref 11.6–15.2)

## 2016-03-21 LAB — APTT: aPTT: 30 seconds (ref 24–37)

## 2016-03-21 LAB — TYPE AND SCREEN
ABO/RH(D): A POS
Antibody Screen: NEGATIVE

## 2016-03-21 NOTE — Pre-Procedure Instructions (Signed)
    Khristal Baydoun  03/21/2016      [facility] [address] [place] [place] [address] Phone: [phone] Fax: [phone]  [facility] - [place], [place] [address] Phone: [phone] Fax: [phone]    Your procedure is scheduled on 03/25/16.  Report to [facility] Admitting at 530 A.M.  Call this number if you have problems the morning of surgery:  [phone]   Remember:  Do not eat food or drink liquids after midnight.  Take these medicines the morning of surgery with A SIP OF WATER --synthroid   Do not wear jewelry, make-up or nail polish.  Do not wear lotions, powders, or perfumes.  You may wear deodorant.  Do not shave 48 hours prior to surgery.  Men may shave face and neck.  Do not bring valuables to the hospital.  [facility] is not responsible for any belongings or valuables.  Contacts, dentures or bridgework may not be worn into surgery.  Leave your suitcase in the car.  After surgery it may be brought to your room.  For patients admitted to the hospital, discharge time will be determined by your treatment team.  Patients discharged the day of surgery will not be allowed to drive home.   Name and phone number of your driver:   Special instructions:    Please read over the following fact sheets that you were given. Pain Booklet, Coughing and Deep Breathing, Blood Transfusion Information, MRSA Information and Surgical Site Infection Prevention

## 2016-03-24 MED ORDER — VANCOMYCIN HCL IN DEXTROSE 1-5 GM/200ML-% IV SOLN
1000.0000 mg | INTRAVENOUS | Status: AC
  Administered 2016-03-25: 1000 mg via INTRAVENOUS
  Filled 2016-03-24: qty 200

## 2016-03-25 ENCOUNTER — Inpatient Hospital Stay (HOSPITAL_COMMUNITY)
Admission: RE | Admit: 2016-03-25 | Discharge: 2016-03-26 | DRG: 517 | Disposition: A | Discharge status: Home / Self Care | Payer: Medicare Other | Source: Ambulatory Visit | Attending: Thoracic Surgery (Cardiothoracic Vascular Surgery) | Admitting: Thoracic Surgery (Cardiothoracic Vascular Surgery)

## 2016-03-25 ENCOUNTER — Encounter (HOSPITAL_COMMUNITY)
Admission: RE | Disposition: A | Discharge status: Home / Self Care | Payer: Self-pay | Source: Ambulatory Visit | Attending: Thoracic Surgery (Cardiothoracic Vascular Surgery)

## 2016-03-25 ENCOUNTER — Inpatient Hospital Stay (HOSPITAL_COMMUNITY): Payer: Medicare Other | Admitting: Anesthesiology

## 2016-03-25 ENCOUNTER — Inpatient Hospital Stay (HOSPITAL_COMMUNITY): Payer: Medicare Other

## 2016-03-25 ENCOUNTER — Ambulatory Visit: Payer: Self-pay | Admitting: Hematology & Oncology

## 2016-03-25 ENCOUNTER — Other Ambulatory Visit: Payer: Self-pay

## 2016-03-25 ENCOUNTER — Encounter (HOSPITAL_COMMUNITY): Payer: Self-pay | Admitting: *Deleted

## 2016-03-25 ENCOUNTER — Ambulatory Visit: Payer: Self-pay

## 2016-03-25 DIAGNOSIS — C6992 Malignant neoplasm of unspecified site of left eye: Secondary | ICD-10-CM | POA: Diagnosis present

## 2016-03-25 DIAGNOSIS — E039 Hypothyroidism, unspecified: Secondary | ICD-10-CM | POA: Diagnosis present

## 2016-03-25 DIAGNOSIS — J302 Other seasonal allergic rhinitis: Secondary | ICD-10-CM | POA: Diagnosis present

## 2016-03-25 DIAGNOSIS — R918 Other nonspecific abnormal finding of lung field: Secondary | ICD-10-CM

## 2016-03-25 DIAGNOSIS — D3610 Benign neoplasm of peripheral nerves and autonomic nervous system, unspecified: Secondary | ICD-10-CM | POA: Diagnosis present

## 2016-03-25 DIAGNOSIS — I10 Essential (primary) hypertension: Secondary | ICD-10-CM | POA: Diagnosis present

## 2016-03-25 DIAGNOSIS — Z79899 Other long term (current) drug therapy: Secondary | ICD-10-CM | POA: Diagnosis not present

## 2016-03-25 DIAGNOSIS — E7800 Pure hypercholesterolemia, unspecified: Secondary | ICD-10-CM | POA: Diagnosis present

## 2016-03-25 DIAGNOSIS — R221 Localized swelling, mass and lump, neck: Secondary | ICD-10-CM | POA: Diagnosis present

## 2016-03-25 DIAGNOSIS — R222 Localized swelling, mass and lump, trunk: Secondary | ICD-10-CM | POA: Diagnosis not present

## 2016-03-25 HISTORY — PX: RESECTION OF MEDIASTINAL MASS: SHX6497

## 2016-03-25 SURGERY — EXCISION, MASS, MEDIASTINUM
Anesthesia: General | Laterality: Right

## 2016-03-25 MED ORDER — FENTANYL CITRATE (PF) 100 MCG/2ML IJ SOLN
25.0000 ug | INTRAMUSCULAR | Status: DC | PRN
  Administered 2016-03-25 (×2): 25 ug via INTRAVENOUS
  Administered 2016-03-25: 50 ug via INTRAVENOUS
  Administered 2016-03-25: 25 ug via INTRAVENOUS

## 2016-03-25 MED ORDER — DIPHENHYDRAMINE HCL 50 MG/ML IJ SOLN
INTRAMUSCULAR | Status: DC | PRN
  Administered 2016-03-25: 12.5 mg via INTRAVENOUS

## 2016-03-25 MED ORDER — ARTIFICIAL TEARS OP OINT
TOPICAL_OINTMENT | OPHTHALMIC | Status: AC
  Filled 2016-03-25: qty 3.5

## 2016-03-25 MED ORDER — FENTANYL CITRATE (PF) 100 MCG/2ML IJ SOLN
INTRAMUSCULAR | Status: DC | PRN
  Administered 2016-03-25 (×4): 50 ug via INTRAVENOUS
  Administered 2016-03-25: 100 ug via INTRAVENOUS

## 2016-03-25 MED ORDER — ROCURONIUM BROMIDE 50 MG/5ML IV SOLN
INTRAVENOUS | Status: AC
  Filled 2016-03-25: qty 1

## 2016-03-25 MED ORDER — ACETAMINOPHEN 325 MG PO TABS: 650.0000 mg | ORAL_TABLET | Freq: Four times a day (QID) | ORAL | Status: DC | PRN

## 2016-03-25 MED ORDER — STERILE WATER FOR INJECTION IJ SOLN
INTRAMUSCULAR | Status: AC
  Filled 2016-03-25: qty 10

## 2016-03-25 MED ORDER — 0.9 % SODIUM CHLORIDE (POUR BTL) OPTIME
TOPICAL | Status: DC | PRN
  Administered 2016-03-25: 2000 mL

## 2016-03-25 MED ORDER — LIDOCAINE HCL (CARDIAC) 20 MG/ML IV SOLN
INTRAVENOUS | Status: DC | PRN
  Administered 2016-03-25: 40 mg via INTRAVENOUS

## 2016-03-25 MED ORDER — FENTANYL CITRATE (PF) 100 MCG/2ML IJ SOLN: 12.5000 ug | INTRAMUSCULAR | Status: DC | PRN

## 2016-03-25 MED ORDER — PHENYLEPHRINE HCL 10 MG/ML IJ SOLN
10.0000 mg | INTRAVENOUS | Status: DC | PRN
  Administered 2016-03-25: 20 ug/min via INTRAVENOUS

## 2016-03-25 MED ORDER — MIDAZOLAM HCL 5 MG/5ML IJ SOLN
INTRAMUSCULAR | Status: DC | PRN
  Administered 2016-03-25: 2 mg via INTRAVENOUS

## 2016-03-25 MED ORDER — ONDANSETRON HCL 4 MG PO TABS: 4.0000 mg | ORAL_TABLET | Freq: Four times a day (QID) | ORAL | Status: DC | PRN

## 2016-03-25 MED ORDER — LISINOPRIL 5 MG PO TABS
5.0000 mg | ORAL_TABLET | Freq: Every day | ORAL | Status: DC
  Administered 2016-03-26: 5 mg via ORAL
  Filled 2016-03-25: qty 1

## 2016-03-25 MED ORDER — OXYCODONE HCL 5 MG PO TABS
5.0000 mg | ORAL_TABLET | ORAL | Status: DC | PRN
  Administered 2016-03-25: 5 mg via ORAL

## 2016-03-25 MED ORDER — ASPIRIN EC 81 MG PO TBEC
81.0000 mg | DELAYED_RELEASE_TABLET | Freq: Every day | ORAL | Status: DC
  Administered 2016-03-26: 81 mg via ORAL
  Filled 2016-03-25: qty 1

## 2016-03-25 MED ORDER — FENTANYL CITRATE (PF) 250 MCG/5ML IJ SOLN
INTRAMUSCULAR | Status: AC
  Filled 2016-03-25: qty 5

## 2016-03-25 MED ORDER — PROPOFOL 10 MG/ML IV BOLUS
INTRAVENOUS | Status: DC | PRN
Start: 2016-03-25 — End: 2016-03-25
  Administered 2016-03-25: 150 mg via INTRAVENOUS

## 2016-03-25 MED ORDER — ONDANSETRON HCL 4 MG/2ML IJ SOLN: 4.0000 mg | Freq: Four times a day (QID) | INTRAMUSCULAR | Status: DC | PRN

## 2016-03-25 MED ORDER — MIDAZOLAM HCL 2 MG/2ML IJ SOLN
INTRAMUSCULAR | Status: AC
  Filled 2016-03-25: qty 2

## 2016-03-25 MED ORDER — FENTANYL CITRATE (PF) 100 MCG/2ML IJ SOLN
INTRAMUSCULAR | Status: AC
  Administered 2016-03-25: 25 ug via INTRAVENOUS
  Filled 2016-03-25: qty 2

## 2016-03-25 MED ORDER — ONDANSETRON HCL 4 MG/2ML IJ SOLN
INTRAMUSCULAR | Status: DC | PRN
  Administered 2016-03-25: 4 mg via INTRAVENOUS

## 2016-03-25 MED ORDER — PROPOFOL 10 MG/ML IV BOLUS
INTRAVENOUS | Status: AC
  Filled 2016-03-25: qty 40

## 2016-03-25 MED ORDER — SENNOSIDES-DOCUSATE SODIUM 8.6-50 MG PO TABS: 1.0000 | ORAL_TABLET | Freq: Every evening | ORAL | Status: DC | PRN

## 2016-03-25 MED ORDER — NEOSTIGMINE METHYLSULFATE 10 MG/10ML IV SOLN
INTRAVENOUS | Status: DC | PRN
  Administered 2016-03-25: 4 mg via INTRAVENOUS

## 2016-03-25 MED ORDER — ADULT MULTIVITAMIN W/MINERALS CH
1.0000 | ORAL_TABLET | Freq: Every day | ORAL | Status: DC
  Administered 2016-03-26: 1 via ORAL
  Filled 2016-03-25: qty 1

## 2016-03-25 MED ORDER — LIDOCAINE HCL (CARDIAC) 20 MG/ML IV SOLN
INTRAVENOUS | Status: AC
  Filled 2016-03-25: qty 5

## 2016-03-25 MED ORDER — SODIUM CHLORIDE 0.9% FLUSH: 3.0000 mL | Freq: Two times a day (BID) | INTRAVENOUS | Status: DC

## 2016-03-25 MED ORDER — BUPIVACAINE HCL (PF) 0.5 % IJ SOLN
INTRAMUSCULAR | Status: AC
  Filled 2016-03-25: qty 30

## 2016-03-25 MED ORDER — DEXAMETHASONE SODIUM PHOSPHATE 4 MG/ML IJ SOLN
INTRAMUSCULAR | Status: DC | PRN
  Administered 2016-03-25: 4 mg via INTRAVENOUS

## 2016-03-25 MED ORDER — TRAMADOL HCL 50 MG PO TABS
50.0000 mg | ORAL_TABLET | Freq: Four times a day (QID) | ORAL | Status: DC | PRN
  Administered 2016-03-25 (×2): 50 mg via ORAL
  Filled 2016-03-25 (×2): qty 1

## 2016-03-25 MED ORDER — OXYCODONE HCL 5 MG PO TABS
ORAL_TABLET | ORAL | Status: AC
  Administered 2016-03-25: 5 mg via ORAL
  Filled 2016-03-25: qty 1

## 2016-03-25 MED ORDER — SUCCINYLCHOLINE CHLORIDE 20 MG/ML IJ SOLN
INTRAMUSCULAR | Status: AC
  Filled 2016-03-25: qty 1

## 2016-03-25 MED ORDER — LACTATED RINGERS IV SOLN
INTRAVENOUS | Status: DC | PRN
  Administered 2016-03-25 (×2): via INTRAVENOUS

## 2016-03-25 MED ORDER — NEOSTIGMINE METHYLSULFATE 10 MG/10ML IV SOLN
INTRAVENOUS | Status: AC
  Filled 2016-03-25: qty 1

## 2016-03-25 MED ORDER — GLYCOPYRROLATE 0.2 MG/ML IJ SOLN
INTRAMUSCULAR | Status: DC | PRN
  Administered 2016-03-25: 0.6 mg via INTRAVENOUS

## 2016-03-25 MED ORDER — ROCURONIUM BROMIDE 100 MG/10ML IV SOLN
INTRAVENOUS | Status: DC | PRN
  Administered 2016-03-25: 50 mg via INTRAVENOUS

## 2016-03-25 MED ORDER — ACETAMINOPHEN 650 MG RE SUPP: 650.0000 mg | Freq: Four times a day (QID) | RECTAL | Status: DC | PRN

## 2016-03-25 MED ORDER — FENTANYL CITRATE (PF) 100 MCG/2ML IJ SOLN
INTRAMUSCULAR | Status: AC
  Filled 2016-03-25: qty 2

## 2016-03-25 MED ORDER — EPHEDRINE SULFATE 50 MG/ML IJ SOLN
INTRAMUSCULAR | Status: AC
  Filled 2016-03-25: qty 1

## 2016-03-25 MED ORDER — KCL IN DEXTROSE-NACL 20-5-0.9 MEQ/L-%-% IV SOLN
INTRAVENOUS | Status: DC
  Administered 2016-03-25 – 2016-03-26 (×2): via INTRAVENOUS
  Filled 2016-03-25 (×4): qty 1000

## 2016-03-25 MED ORDER — ONDANSETRON HCL 4 MG/2ML IJ SOLN
INTRAMUSCULAR | Status: AC
  Filled 2016-03-25: qty 2

## 2016-03-25 MED ORDER — GLYCOPYRROLATE 0.2 MG/ML IJ SOLN
INTRAMUSCULAR | Status: AC
  Filled 2016-03-25: qty 2

## 2016-03-25 MED ORDER — SCOPOLAMINE 1 MG/3DAYS TD PT72
MEDICATED_PATCH | TRANSDERMAL | Status: DC | PRN
  Administered 2016-03-25: 1 via TRANSDERMAL

## 2016-03-25 MED ORDER — LEVOTHYROXINE SODIUM 75 MCG PO TABS
75.0000 ug | ORAL_TABLET | Freq: Every day | ORAL | Status: DC
  Administered 2016-03-26: 75 ug via ORAL
  Filled 2016-03-25: qty 1

## 2016-03-25 SURGICAL SUPPLY — 45 items
ADH SKN CLS APL DERMABOND .7 (GAUZE/BANDAGES/DRESSINGS) ×1
CANISTER SUCTION 2500CC (MISCELLANEOUS) ×3 IMPLANT
CLIP TI MEDIUM 24 (CLIP) ×3 IMPLANT
CLIP TI WIDE RED SMALL 24 (CLIP) ×6 IMPLANT
CONT SPEC 4OZ CLIKSEAL STRL BL (MISCELLANEOUS) ×8 IMPLANT
COVER SURGICAL LIGHT HANDLE (MISCELLANEOUS) ×3 IMPLANT
DERMABOND ADVANCED (GAUZE/BANDAGES/DRESSINGS) ×2
DERMABOND ADVANCED .7 DNX12 (GAUZE/BANDAGES/DRESSINGS) ×1 IMPLANT
DRAIN HEMOVAC 1/8 X 5 (WOUND CARE) ×2 IMPLANT
DRAPE CHEST BREAST 15X10 FENES (DRAPES) ×3 IMPLANT
ELECT REM PT RETURN 9FT ADLT (ELECTROSURGICAL) ×3
ELECTRODE REM PT RTRN 9FT ADLT (ELECTROSURGICAL) ×1 IMPLANT
EVACUATOR SILICONE 100CC (DRAIN) ×2 IMPLANT
GLOVE BIO SURGEON STRL SZ 6.5 (GLOVE) ×1 IMPLANT
GLOVE BIO SURGEONS STRL SZ 6.5 (GLOVE) ×1
GLOVE BIOGEL PI IND STRL 6.5 (GLOVE) IMPLANT
GLOVE BIOGEL PI INDICATOR 6.5 (GLOVE) ×8
GLOVE ECLIPSE 6.5 STRL STRAW (GLOVE) ×4 IMPLANT
GLOVE SURG SIGNA 7.5 PF LTX (GLOVE) ×3 IMPLANT
GOWN STRL REUS W/ TWL LRG LVL3 (GOWN DISPOSABLE) ×1 IMPLANT
GOWN STRL REUS W/ TWL XL LVL3 (GOWN DISPOSABLE) ×1 IMPLANT
GOWN STRL REUS W/TWL LRG LVL3 (GOWN DISPOSABLE) ×6
GOWN STRL REUS W/TWL XL LVL3 (GOWN DISPOSABLE) ×3
KIT BASIN OR (CUSTOM PROCEDURE TRAY) ×3 IMPLANT
KIT ROOM TURNOVER OR (KITS) ×3 IMPLANT
NS IRRIG 1000ML POUR BTL (IV SOLUTION) ×5 IMPLANT
PACK CHEST (CUSTOM PROCEDURE TRAY) ×2 IMPLANT
PAD ARMBOARD 7.5X6 YLW CONV (MISCELLANEOUS) ×6 IMPLANT
SUT PROLENE 4 0 RB 1 (SUTURE) ×3
SUT PROLENE 4-0 RB1 .5 CRCL 36 (SUTURE) IMPLANT
SUT SILK 3 0 (SUTURE) ×6
SUT SILK 3 0 SH 30 (SUTURE) ×2 IMPLANT
SUT SILK 3-0 18XBRD TIE 12 (SUTURE) IMPLANT
SUT VIC AB 2-0 CT1 27 (SUTURE) ×3
SUT VIC AB 2-0 CT1 TAPERPNT 27 (SUTURE) ×1 IMPLANT
SUT VIC AB 3-0 SH 18 (SUTURE) IMPLANT
SUT VIC AB 3-0 SH 27 (SUTURE) ×9
SUT VIC AB 3-0 SH 27X BRD (SUTURE) ×1 IMPLANT
SUT VIC AB 3-0 X1 27 (SUTURE) ×3 IMPLANT
TOWEL OR 17X24 6PK STRL BLUE (TOWEL DISPOSABLE) ×3 IMPLANT
TOWEL OR 17X26 10 PK STRL BLUE (TOWEL DISPOSABLE) ×3 IMPLANT
TUBE ANAEROBIC SPECIMEN COL (MISCELLANEOUS) IMPLANT
TUBE CONNECTING 12'X1/4 (SUCTIONS) ×1
TUBE CONNECTING 12X1/4 (SUCTIONS) ×1 IMPLANT
WATER STERILE IRR 1000ML POUR (IV SOLUTION) ×3 IMPLANT

## 2016-03-25 NOTE — Anesthesia Procedure Notes (Signed)
Procedure Name: Intubation Date/Time: 03/25/2016 7:42 AM Performed by: [place] Pink Pre-anesthesia Checklist: Patient identified, Emergency Drugs available, Suction available, Patient being monitored and Timeout performed Patient Re-evaluated:Patient Re-evaluated prior to inductionOxygen Delivery Method: Circle system utilized Preoxygenation: Pre-oxygenation with 100% oxygen Intubation Type: IV induction Ventilation: Mask ventilation without difficulty Laryngoscope Size: Mac and 4 Grade View: Grade I Tube type: Oral Tube size: 7.5 mm Number of attempts: 1 Airway Equipment and Method: Stylet Placement Confirmation: ETT inserted through vocal cords under direct vision,  positive ETCO2 and breath sounds checked- equal and bilateral Secured at: 20 cm Tube secured with: Tape Dental Injury: Teeth and Oropharynx as per pre-operative assessment

## 2016-03-25 NOTE — Interval H&P Note (Signed)
History and Physical Interval Note:  03/25/2016 7:23 AM  Olivia Espinoza  has presented today for surgery, with the diagnosis of MASS RIGHT THORACIC INLET   The various methods of treatment have been discussed with the patient and family. After consideration of risks, benefits and other options for treatment, the patient has consented to  Procedure(s): RESECTION OF MASS RIGHT THORACIC INLET VIA SUPRACLAVICULAR APPROACH (Right) as a surgical intervention .  The patient's history has been reviewed, patient examined, no change in status, stable for surgery.  I have reviewed the patient's chart and labs.  Questions were answered to the patient's satisfaction.     Melrose Nakayama

## 2016-03-25 NOTE — Anesthesia Postprocedure Evaluation (Signed)
Anesthesia Post Note  Patient: Olivia Espinoza  Procedure(s) Performed: Procedure(s) (LRB): RESECTION OF MASS RIGHT THORACIC INLET VIA SUPRACLAVICULAR APPROACH (Right)  Patient location during evaluation: PACU Anesthesia Type: General Level of consciousness: awake and alert Pain management: pain level controlled Vital Signs Assessment: post-procedure vital signs reviewed and stable Respiratory status: spontaneous breathing, nonlabored ventilation, respiratory function stable and patient connected to nasal cannula oxygen Cardiovascular status: blood pressure returned to baseline and stable Postop Assessment: no signs of nausea or vomiting Anesthetic complications: no    Last Vitals:  Filed Vitals:   03/25/16 1315 03/25/16 1330  BP:    Pulse: 50 50  Temp:    Resp: 17 18    Last Pain:  Filed Vitals:   03/25/16 1330  PainSc: 1                  Jaliah Foody L

## 2016-03-25 NOTE — Anesthesia Preprocedure Evaluation (Signed)
Anesthesia Evaluation  Patient identified by MRN, date of birth, ID band Patient awake    Reviewed: Allergy & Precautions, H&P , NPO status , Patient's Chart, lab work & pertinent test results  History of Anesthesia Complications (+) PONV  Airway Mallampati: II  TM Distance: >3 FB Neck ROM: full    Dental no notable dental hx. (+) Dental Advisory Given, Teeth Intact   Pulmonary neg pulmonary ROS,    Pulmonary exam normal breath sounds clear to auscultation       Cardiovascular Exercise Tolerance: Good hypertension, Pt. on medications negative cardio ROS Normal cardiovascular exam Rhythm:regular Rate:Normal  palpitations   Neuro/Psych Paraspinal mass negative neurological ROS  negative psych ROS   GI/Hepatic negative GI ROS, Neg liver ROS,   Endo/Other  negative endocrine ROSHypothyroidism   Renal/GU negative Renal ROS  negative genitourinary   Musculoskeletal   Abdominal   Peds  Hematology negative hematology ROS (+)   Anesthesia Other Findings   Reproductive/Obstetrics negative OB ROS                             Anesthesia Physical Anesthesia Plan  ASA: III  Anesthesia Plan: General   Post-op Pain Management:    Induction: Intravenous  Airway Management Planned: Oral ETT  Additional Equipment:   Intra-op Plan:   Post-operative Plan: Extubation in OR  Informed Consent: I have reviewed the patients History and Physical, chart, labs and discussed the procedure including the risks, benefits and alternatives for the proposed anesthesia with the patient or authorized representative who has indicated his/her understanding and acceptance.   Dental Advisory Given  Plan Discussed with: CRNA  Anesthesia Plan Comments:         Anesthesia Quick Evaluation

## 2016-03-25 NOTE — H&P (View-Only) (Signed)
PCP is No primary care provider on file. Referring Provider is Marin Olp, Rudell Cobb, MD  Chief Complaint  Patient presents with  . 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  Olivia Espinoza recently had a melanoma of the left eye treated with an I-125 patch. A CT of the chest and abdomen was done to rule out any evidence of melanoma elsewhere. A mass was noted in the right thoracic inlet. An MR was done which showed the mass to be 4.2 cm. The mass did not involve the neural foramina. It did abut the T1 and T2 costovertebral junction. There is no bony invasion. A PET CT was done which showed the mass was mildly hypermetabolic with an SUV of 3.9.  Olivia Espinoza has been feeling well. She denies any chest pain, pressure, or tightness. She has had a productive cough and is currently taking medication for sinus infection. She has not had any significant shortness of breath or wheezing. She denies any weight loss. She is retired Radio producer. She remains relatively active although she does not exercise on a regular basis.  Zubrod Score: At the time of surgery this patient's most appropriate activity status/level should be described as: [x]     0    Normal activity, no symptoms []     1    Restricted in physical strenuous activity but ambulatory, able to do out light work []     2    Ambulatory and capable of self care, unable to do work activities, up and about >50 % of waking hours                              []     3    Only limited self care, in bed greater than 50% of waking hours []     4    Completely disabled, no self care, confined to bed or chair []     5    Moribund    Past Medical History  Diagnosis Date  . Hypertension   . Hyperlipemia   . Hypothyroidism   . Seasonal allergies    . Paraspinal mass     Past Surgical History  Procedure Laterality Date  . Btl miscarrage      Family History  Problem Relation Age of Onset  . CVA Father     Social History Social History  Substance Use Topics  . Smoking status: Never Smoker   . Smokeless tobacco: None  . Alcohol Use: 0.0 oz/week    0 Standard drinks or equivalent per week     Comment: occassionally    Current Outpatient Prescriptions  Medication Sig Dispense Refill  . amoxicillin (AMOXIL) 500 MG capsule TK 1 C PO TID UNTIL GONE  6  . atorvastatin (LIPITOR) 10 MG tablet 10 mg. TAKES TWICE A WEEK    . levothyroxine (SYNTHROID) 75 MCG tablet Take by mouth daily before breakfast.     . lisinopril (PRINIVIL,ZESTRIL) 5 MG tablet TAKE ONE TABLET BY MOUTH ONE TIME DAILY 30 tablet 0   No current facility-administered medications for this visit.    Allergies  Allergen Reactions  . Ceclor [Cefaclor]   . Ibuprofen Hives  . Motrin [Ibuprofen] Hives    Review of Systems  Constitutional: Negative for fever, chills, activity change, appetite change and unexpected weight change.  HENT: Positive for congestion and sinus pressure. Negative for trouble swallowing and voice change.   Eyes: Positive for visual disturbance.       Melanoma of left eye  Respiratory: Positive for cough. Negative for shortness of breath and wheezing.   Cardiovascular: Negative for chest pain, palpitations and leg swelling.  Gastrointestinal: Negative for abdominal pain and blood in stool.  Endocrine:       Hypothyroid  Musculoskeletal: Negative for neck pain and neck stiffness.  Neurological: Negative for dizziness, seizures, syncope and headaches.  Hematological: Negative for adenopathy. Bruises/bleeds easily (Bruises easily no history of excessive bleeding).  Psychiatric/Behavioral: The patient is nervous/anxious (Anxious about tumor).     BP 150/60 mmHg  Pulse 72  Resp 16  Ht 5\' 2"  (1.575 m)  Wt 132 lb (59.875 kg)  BMI 24.14  kg/m2  SpO2 98% Physical Exam  Constitutional: She is oriented to person, place, and time. She appears well-developed and well-nourished. No distress.  HENT:  Head: Normocephalic and atraumatic.  Eyes: Conjunctivae and EOM are normal. No scleral icterus.  Neck: Neck supple. No thyromegaly present.  Fullness in right supraclavicular area but no definite mass  Cardiovascular: Normal rate, regular rhythm, normal heart sounds and intact distal pulses.  Exam reveals no gallop and no friction rub.   No murmur heard. Pulmonary/Chest: Effort normal and breath sounds normal. She has no wheezes. She has no rales.  Abdominal: Soft. There is no tenderness.  Musculoskeletal: Normal range of motion. She exhibits no edema.  Lymphadenopathy:    She has no cervical adenopathy.  Neurological: She is alert and oriented to person, place, and time. A cranial nerve deficit is present.  Motor intact  Skin: Skin is warm and dry.  Vitals reviewed.    Diagnostic Tests:  MRI THORACIC SPINE WITHOUT AND WITH CONTRAST  TECHNIQUE: Multiplanar and multiecho pulse sequences of the thoracic spine were obtained without and with intravenous contrast.  CONTRAST: 27mL MULTIHANCE GADOBENATE DIMEGLUMINE 529 MG/ML IV SOLN  COMPARISON: [facility] chest CT 11/28/2015, [facility] chest radiographs 11/28/2015. [facility] semi upright portable chest radiograph 4219.  FINDINGS: Limited sagittal imaging of the cervical spine remarkable for mild degenerative appearing spondylolisthesis at C5-C6. Visible cervical spine bone marrow signal appears normal.  The oval right paraspinal mass is re- demonstrated adjacent to the right anterolateral T1-T2 level. This encompasses 25 x 36 x 42 mm, and intensely enhances following contrast. Maximum dimension was approximately 37 mm in November. It does not appear to be intimately associated with the right upper thoracic neural foramina, but rather is nearest  the right T1 and T2 costovertebral junctions. No associated bone invasion or adjacent marrow edema is identified. The lesion is immediately deep to the right subclavian artery at the level of the right vertebral artery origin (series 11, image 9). The right V1 segment of the vertebral artery is mildly displaced due to the mass. The lesion is immediately lateral to the right wall of the trachea at the thoracic inlet. The mass appears to be in the superior extrapleural space. The right lung apex otherwise appears normal. There is no surrounding soft tissue inflammation.  Underlying thoracic vertebral height, alignment, and marrow signal is normal. No marrow edema or evidence of acute osseous abnormality. No abnormal thoracic spine enhancement. The other thoracic paraspinal soft tissues are within normal limits.  Normal thoracic spinal canal patency. Spinal cord signal is within normal  limits at all visualized levels. No dural thickening or enhancement. No abnormal intradural enhancement. The conus medullaris is not included, occurs below the T11-T12 level.  Other visualized thoracic viscera appear within normal limits.  IMPRESSION: 1. Solitary 4.2 cm intensely enhancing right thoracic inlet paraspinal mass. This appears mildly enlarged since November. Also, it is not intimately associated with the upper thoracic neural foramina, arguing against a chronic nerve sheath tumor. This abuts the right T1 and T2 costovertebral junctions, but there is no associated bone invasion or marrow edema. No surrounding inflammation. Note the adjacent right right subclavian and proximal vertebral arteries. 2. Otherwise negative thoracic spine.   Electronically Signed  By: Genevie Ann M.D.  On: 01/26/2016 12:16 NUCLEAR MEDICINE PET WHOLE BODY  TECHNIQUE: 6.9 mCi F-18 FDG was injected intravenously. Full-ring PET imaging was performed from the vertex to the feet after the radiotracer. CT data  was obtained and used for attenuation correction and anatomic localization.  FASTING BLOOD GLUCOSE: Value: 81 mg/dl  COMPARISON: Thoracic spine MRI of 01/26/2016. Outside CT of the chest of 11/28/2015. Abdominal pelvic CT of 02/11/2014.  FINDINGS: HEAD/NECK  No areas of abnormal hypermetabolism.  CHEST  Mild hypermetabolism corresponding to the oval mass centered about the superior medial aspect of the right pleural or extrapleural space, intimately associated with the first and second right ribs. This measures 2.8 x 4.2 cm and a S.U.V. max of 3.9 on image 76/series 4. 3.6 x 2.7 cm on 11/28/2015, suggesting mild interval enlargement. Relatively well-circumscribed, without surrounding invasion.  No areas of abnormal thoracic nodal hypermetabolism. No pulmonary parenchymal hypermetabolism.  ABDOMEN/PELVIS  No areas of abnormal hypermetabolism.  SKELETON  No abnormal marrow activity. No evidence of hypermetabolic skin lesions.  CT IMAGES PERFORMED FOR ATTENUATION CORRECTION  No cervical adenopathy.  Normal heart size, without pericardial effusion. Multivessel coronary artery atherosclerosis. Clear lungs. Advanced abdominal aortic atherosclerosis. Pelvic floor laxity.  IMPRESSION: 1. Mild hypermetabolism, corresponding to the well-circumscribed lesion within the superomedial aspect of the right hemi thorax. Although metastatic disease cannot be entirely excluded, given relatively well-circumscribed appearance, and absence of metastasis elsewhere, alternate etiologies including fibrous tumor of the pleura and atypical appearance of extramedullary hematopoiesis are considerations. Tissue sampling should be considered. 2. Atherosclerosis, including within the coronary arteries.   Electronically Signed  By: Abigail Miyamoto M.D.  On: 02/14/2016 15:14  I personally reviewed the CT, MRI, and PET/CT and concur with the findings as noted  above.  Impression: Mrs. Olivia Espinoza is a 75 year old woman who was found to have a 2.8 x 4.2 cm mass in the right thoracic inlet. This is mildly hypermetabolic by PET with an SUV of 3.9. Is relatively low for a mass of this size. The mass appears well-circumscribed and there is no evidence of invasion into surrounding structures. I think it's unlikely that this is related to her very small ocular melanoma. Most likely this is a benign tumor of some type.  Masses not easily palpable so I don't think an incisional biopsy will be particular helpful as opposed to going ahead and resecting the mass, as the exposure will be similar in either case. Radiology was reluctant to attempt to needle the mass.  We discussed the options of continued radiographic observation versus surgical resection. I discussed the location of the mass with the proximity of the subclavian, vertebral, and carotid arteries, the jugular vein, the nerve roots, the phrenic and recurrent laryngeal nerves. She understands that any of these structures could potentially be injured with surgical resection. She  also understands that if the mass were to continue to grow that these structures could be compromised by it.  She strongly favors surgical resection.  I think this would best be removed from a supraclavicular approach. It is high enough that I don't think removal of a portion of the clavicle or a sternotomy would be necessary. I described the proposed operation to her in detail including the need for general anesthesia, the incisions to be used, the expected hospital stay, and the overall recovery. I reviewed the indications, risks, benefits, and alternatives. She understands the risk include, but not limited to death, MI, DVT, PE, bleeding, possible need for transfusion, infection, stroke, impaired function of right upper extremity, hoarseness, paralyzed diaphragm, as well as the possibility of other unforeseeable complications.  She accepts  the risks and wishes to proceed.  Plan:  Resection of right thoracic inlet mass via supraclavicular approach on Monday, 03/25/2016   Melrose Nakayama, MD [facility] [phone]

## 2016-03-25 NOTE — Transfer of Care (Signed)
Immediate Anesthesia Transfer of Care Note  Patient: Olivia Espinoza  Procedure(s) Performed: Procedure(s): RESECTION OF MASS RIGHT THORACIC INLET VIA SUPRACLAVICULAR APPROACH (Right)  Patient Location: PACU  Anesthesia Type:General  Level of Consciousness: awake, alert  and oriented  Airway & Oxygen Therapy: Patient Spontanous Breathing and Patient connected to nasal cannula oxygen  Post-op Assessment: Report given to RN and Post -op Vital signs reviewed and stable  Post vital signs: Reviewed and stable  Last Vitals:  Filed Vitals:   03/25/16 0605 03/25/16 1115  BP: 181/95   Pulse: 73   Temp: 36.8 C 36.5 C  Resp: 20     Complications: No apparent anesthesia complications

## 2016-03-25 NOTE — Progress Notes (Addendum)
Pt arrived on the unit from PACU on a hospital bed, assesment completed see flowsheet, pt placed on tele, ccmd notified, pt orienteed to room and staff, call light within reach, will continue to monitor

## 2016-03-25 NOTE — Brief Op Note (Addendum)
03/25/2016  11:02 AM  PATIENT:  Olivia Espinoza  75 y.o. 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 Frozen of one lymph node was BENIGN. Frozen of right mass was consistent with a SCHWANNOMA  COUNTS CORRECT:  YES  PLAN OF CARE: Admit to inpatient   PATIENT DISPOSITION:  PACU - hemodynamically stable.   Delay start of Pharmacological VTE agent (>24hrs) due to surgical blood loss or risk of bleeding: yes

## 2016-03-26 ENCOUNTER — Encounter (HOSPITAL_COMMUNITY): Payer: Self-pay | Admitting: Thoracic Surgery (Cardiothoracic Vascular Surgery)

## 2016-03-26 LAB — BASIC METABOLIC PANEL
Anion gap: 7 (ref 5–15)
BUN: 8 mg/dL (ref 6–20)
CHLORIDE: 106 mmol/L (ref 101–111)
CO2: 25 mmol/L (ref 22–32)
Calcium: 8.5 mg/dL — ABNORMAL LOW (ref 8.9–10.3)
Creatinine, Ser: 0.94 mg/dL (ref 0.44–1.00)
GFR calc Af Amer: >60 mL/min (ref >60)
GFR calc non Af Amer: 59 mL/min — ABNORMAL LOW (ref >60)
GLUCOSE: 119 mg/dL — AB (ref 65–99)
POTASSIUM: 4.6 mmol/L (ref 3.5–5.1)
SODIUM: 138 mmol/L (ref 135–145)

## 2016-03-26 LAB — CBC
HEMATOCRIT: 35.3 % — AB (ref 36.0–46.0)
HEMOGLOBIN: 11.2 g/dL — AB (ref 12.0–15.0)
MCH: 29.9 pg (ref 26.0–34.0)
MCHC: 31.7 g/dL (ref 30.0–36.0)
MCV: 94.1 fL (ref 78.0–100.0)
Platelets: 328 10*3/uL (ref 150–400)
RBC: 3.75 MIL/uL — AB (ref 3.87–5.11)
RDW: 12.8 % (ref 11.5–15.5)
WBC: 15.2 10*3/uL — ABNORMAL HIGH (ref 4.0–10.5)

## 2016-03-26 MED ORDER — TRAMADOL HCL 50 MG PO TABS: 50.0000 mg | ORAL_TABLET | Freq: Four times a day (QID) | ORAL | Status: DC | PRN

## 2016-03-26 NOTE — Discharge Instructions (Signed)
Activity: 1.May walk up steps                2.No lifting more than ten pounds for two weeks.                 3.No driving for two weeks.                4.Continue with your breathing exercises daily.  Diet: Low fat, Low saltdiet  Wound Care: May shower.  Clean wounds with mild soap and water daily. Contact the office at [phone] if any problems arise.

## 2016-03-26 NOTE — Discharge Summary (Signed)
Physician Discharge Summary       [address]       [place] [address]             [phone]    Patient ID: Olivia Espinoza MRN: [id number] DOB/AGE: August 02, 1941 75 y.o.  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 Hypertension 3. Hyperlipidemia 4. Hypothyroidism 5. 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 Roxan Hockey on 03/25/2016.  Pathology: 1. Lymph node, biopsy, Right Supraclavicular - ONE BENIGN LYMPH NODE (0/1). 2. Soft tissue mass, simple excision, Right Supraclavicular Mass #2 - SCHWANNOMA. - MARGINS NOT INVOLVED. 3. Soft tissue mass, simple excision, Right Supraclavicular - SEVENTEEN BENIGN LYMPH NODES (0/17).  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  Olivia Espinoza recently had a melanoma of the left eye treated with an I-125 patch. A CT of the chest and abdomen was done to rule out any evidence of melanoma elsewhere. A mass was noted in the right thoracic inlet. An MR was done which showed the mass to be 4.2 cm. The mass did not involve the neural foramina. It did abut the T1 and T2 costovertebral junction. There is no bony invasion. A PET CT was done which showed the mass was mildly hypermetabolic with an SUV of 3.9.  Olivia Espinoza has been feeling well. She denies any chest pain, pressure, or tightness. She has had a productive cough and is currently taking medication for sinus infection. She has not had any significant shortness of breath or wheezing. She denies any weight loss. She  is retired Radio producer. She remains relatively active although she does not exercise on a regular basis.  She was found to have a 2.8 x 4.2 cm mass in the right thoracic inlet. This is mildly hypermetabolic by PET with an SUV of 3.9. Is relatively low for a mass of this size. The mass appears well-circumscribed and there is no evidence of invasion into surrounding structures. Dr. Roxan Hockey thought it was unlikely that this is related to her very small ocular melanoma. Most likely this is a benign tumor of some type. Radiology was reluctant to attempt to needle the mass. We discussed the options of continued radiographic observation versus surgical resection. I discussed the location of the mass with the proximity of the subclavian, vertebral, and carotid arteries, the jugular vein, the nerve roots, the phrenic and recurrent laryngeal nerves. She strongly favors surgical resection. Dr. Roxan Hockey thought it would best be removed from a I think this would best be removed from a supraclavicular approach. She was admitted on 03/25/2016 in order to undergo resection of paraspinal mass via right supraclavicular approach.  Brief Hospital Course:  Foley was removed morning of post op day one. She was later able to void on own. She was tolerating a diet so IV fluids were stopped. She remained afebrile and hemodynamically stable. Her JP drain had 65 cc from surgery until about 7:45 am the morning of post operative day one. The output was sero sanguinous. Dr. Roxan Hockey had the drain removed. She was felt surgically stable for discharge today.  Latest Vital Signs: Blood pressure  158/79, pulse 76, temperature 98.9 F (37.2 C), temperature source Oral, resp. rate 19, SpO2 96 %.  Physical Exam: Cardiovascular: RRR. Pulmonary: Clear to auscultation bilaterally; no rales, wheezes, or rhonchi. Abdomen: Soft, non tender, bowel sounds present. Extremities: SCDs in place Wounds: Clean and dry. No erythema or  signs of infection.  Discharge Condition: Surgically stable for discharge home  Recent laboratory studies:  Lab Results  Component Value Date   WBC 15.2* 03/26/2016   HGB 11.2* 03/26/2016   HCT 35.3* 03/26/2016   MCV 94.1 03/26/2016   PLT 328 03/26/2016   Lab Results  Component Value Date   NA 138 03/26/2016   K 4.6 03/26/2016   CL 106 03/26/2016   CO2 25 03/26/2016   CREATININE 0.94 03/26/2016   GLUCOSE 119* 03/26/2016      Diagnostic Studies: Dg Chest 2 View  03/25/2016  CLINICAL DATA:  Preoperative evaluation for lung mass EXAM: CHEST  2 VIEW COMPARISON:  Chest radiograph November 28, 2015 and PET-CT February 14, 2016 FINDINGS: The mean mass lesion in the right apex is again noted measuring 2.9 x 2.1 cm. There is mild scarring in the periphery of the right upper lobe. The lungs elsewhere are clear. Heart size and pulmonary vascularity are normal. There is no change in mediastinal or hilar contour. No bone lesions. IMPRESSION: Stable mass in the medial right apex region. Mild scarring right upper lobe. No new opacity. No change in cardiac silhouette. Electronically Signed   By: Lowella Grip III M.D.   On: 03/25/2016 07:10    Discharge Medications:   Medication List    STOP taking these medications        amoxicillin 500 MG capsule  Commonly known as:  AMOXIL      TAKE these medications        aspirin EC 81 MG tablet  Take 81 mg by mouth daily.     atorvastatin 10 MG tablet  Commonly known as:  LIPITOR  Take 10 mg by mouth 2 (two) times a week. Sunday & thursday     cetirizine 10 MG tablet  Commonly known as:  ZYRTEC  Take 10 mg by mouth daily as needed for allergies.     fluticasone 50 MCG/ACT nasal spray  Commonly known as:  FLONASE  Place 1 spray into both nostrils daily as needed for allergies or rhinitis.     lisinopril 5 MG tablet  Commonly known as:  PRINIVIL,ZESTRIL  TAKE ONE TABLET BY MOUTH ONE TIME DAILY     multivitamin with minerals Tabs  tablet  Take 1 tablet by mouth daily.     SYNTHROID 75 MCG tablet  Generic drug:  levothyroxine  Take by mouth daily before breakfast.     traMADol 50 MG tablet  Commonly known as:  ULTRAM  Take 1 tablet (50 mg total) by mouth every 6 (six) hours as needed for moderate pain.        Follow Up Appointments:     Follow-up Information    Follow up with Melrose Nakayama, MD On 04/09/2016.   Specialty:  Cardiothoracic Surgery   Why:  PA/LAT CXR to be taken (at [facility] which is in the same building as Dr. Leonarda Salon office) at 04/09/2016 and 12:15 pm;Appointment time is at 1:00 pm   Contact information:   [address] [place] [id number] [phone]       Signed: Lars Pinks MPA-C 03/26/2016, 1:22 PM

## 2016-03-26 NOTE — Progress Notes (Signed)
JP drain dc as ordered. Will continue to monitor

## 2016-03-26 NOTE — Progress Notes (Addendum)
Pt in stable condition, went over pt education with pt , pt verbalised understanding , iv taken out, cardiac monitor dc,ccmd notified, pt belongings at bedside, paper prescription given to pt

## 2016-03-26 NOTE — Progress Notes (Addendum)
      [address]       [place] [address]             [phone]       1 Day Post-Op Procedure(s) (LRB): RESECTION OF MASS RIGHT THORACIC INLET VIA SUPRACLAVICULAR APPROACH (Right)  Subjective: Patient with some right neck pain.  Objective: Vital signs in last 24 hours: Temp:  [97.7 F (36.5 C)-98.6 F (37 C)] 98.6 F (37 C) (03/28 0413) Pulse Rate:  [50-70] 70 (03/28 0413) Cardiac Rhythm:  [-] Normal sinus rhythm (03/27 1900) Resp:  [8-18] 18 (03/28 0413) BP: (132-180)/(58-88) 132/58 mmHg (03/28 0413) SpO2:  [97 %-100 %] 97 % (03/28 0413)     Intake/Output from previous day: 03/27 0701 - 03/28 0700 In: 1980 [P.O.:480; I.V.:1500] Out: 1955 [Urine:1750; Drains:165; Blood:40]   Physical Exam:  Cardiovascular: RRR. Pulmonary: Clear to auscultation bilaterally; no rales, wheezes, or rhonchi. Abdomen: Soft, non tender, bowel sounds present. Extremities: SCDs in place Wounds: Clean and dry.  No erythema or signs of infection.   Lab Results: CBC: Recent Labs  03/26/16 0341  WBC 15.2*  HGB 11.2*  HCT 35.3*  PLT 328   BMET:  Recent Labs  03/26/16 0341  NA 138  K 4.6  CL 106  CO2 25  GLUCOSE 119*  BUN 8  CREATININE 0.94  CALCIUM 8.5*    PT/INR: No results for input(s): LABPROT, INR in the last 72 hours. ABG:  INR: Will add last result for INR, ABG once components are confirmed Will add last 4 CBG results once components are confirmed  Assessment/Plan:  1. CV - SR in the 80's. 2.  Pulmonary - On 2 liters of oxygen via [place].  3. H and H stable at 11.2 and 35.3 4.JP drain with 40 cc of serosanguinous output last 12 hours. Approximately 25 cc in there this am. 5. Remove foley 6. Possible discharge later today  ZIMMERMAN,DONIELLE MPA-C 03/26/2016,7:46 AM  Patient seen and examined, agree with above Dc JP Possibly home later today  Carrier. Roxan Hockey, MD [facility] [phone]

## 2016-03-26 NOTE — Op Note (Signed)
NAMEVALJEAN, GOLIN NO.:  [id number]  MEDICAL RECORD NO.:  [id number]  LOCATION:  2W04C                        FACILITY:  [facility]  PHYSICIAN:  Revonda Standard. Roxan Hockey, M.D.DATE OF BIRTH:  1941/04/20  DATE OF PROCEDURE:  03/25/2016 DATE OF DISCHARGE:                              OPERATIVE REPORT   PREOPERATIVE DIAGNOSIS:  Paraspinal mass, right thoracic inlet.  POSTOPERATIVE DIAGNOSIS:  Paraspinal mass, right thoracic inlet, probable schwannoma.  PROCEDURE:  Resection of right thoracic inlet paraspinal mass via right supraclavicular approach.  SURGEON:  Revonda Standard. Roxan Hockey, M.D.  ASSISTANT:  Lars Pinks, PA.  ANESTHESIA:  General.  FINDINGS:   1. Multiple mildly enlarged, pink lymph nodes.  Frozen section of one of the nodes was benign.  2. 3 x 4 cm paraspinous mass- well encapsulated.  No invasion of surrounding structures.  Frozen section revealed probable schwannoma.  CLINICAL NOTE:  Ms. Hosterman is a 75 year old woman who was found on imaging to have a mass in the right thoracic inlet.  She recently had a small melanoma of the eye treated with radioactive patch.  A staging CT of the chest and abdomen was done to rule out metastatic melanoma.  A mass was noted in the right thoracic inlet.  MRI showed the mass to be 4.2 cm. It did not involve the neural foramina.  There was no bony invasion.  A PET- CT showed the mass was mildly hypermetabolic with an SUV of 3.9.  The patient was advised to have the mass resected.  The indications, risks, benefits, and alternatives were discussed in detail with the patient. She understood and accepted the risks and agreed to proceed.  OPERATIVE NOTE:  Ms. Schlein was brought to the operating room on March 25, 2016.  She had induction of general anesthesia.  A Foley catheter was placed.  Sequential compressive devices were placed on the calves for DVT prophylaxis.  The right neck and chest were prepped and  draped in usual sterile fashion.  An incision was made in the right supraclavicular area at the base of the neck, this extended down to the head of the clavicle on the right side.  The skin and subcutaneous tissue were divided.  Hemostasis was achieved with electrocautery.  The platysma was divided.  The external jugular vein was identified and ligated and divided.  There were multiple small cutaneous nerves, which were identified and preserved.  The scalene fat pad was identified and was dissected out.  A supraclavicular nerve was identified and preserved. There were multiple small blood vessels feeding the lymph nodes in the fat pad, these were clipped and divided.  Larger vessels were ligated and divided.  There was a firm pink node that was mildly enlarged, this was relatively separate from the remainder of the nodes, and it was sent for frozen section, which subsequently returned showing a benign reactive lymph node.  The remainder of the scalene fat pad was resected with the nodes and sent for permanent pathology.  The internal jugular vein was identified as was the omohyoid muscle.  The internal jugular vein was skeletonized.  The carotid artery was identified but not dissected out. The dissection was carried more inferiorly and the  subclavian artery was identified.  The paraspinal mass then was identified.  It was encapsulated, it was relatively difficult to dissect out due to the surrounding structures.  As it was being dissected out, there was arterial bleeding. The vertebral artery had been partially avulsed from the right subclavian artery, it was clipped and oversewn. The mass then shelled out easily and was removed and sent for frozen section which returned showing benign tumor, probably a schwannoma.  The wound was copiously irrigated with warm saline.  There was good hemostasis.  A 10-French Jackson-Pratt drain was tunneled through a separate incision and secured to the skin  with a 3-0 silk suture.  The platysma was closed with a running 3-0 Vicryl suture, and the skin was closed with a 4-0 Vicryl subcuticular suture.  Dermabond was applied to the incision. The drain was placed to bulb suction.  The patient was extubated in the operating room and taken to the postanesthetic care unit in good condition.  She was noted to be neurologically intact.     Revonda Standard Roxan Hockey, M.D.     SCH/MEDQ  D:  03/25/2016  T:  03/26/2016  Job:  [id number]

## 2016-03-27 ENCOUNTER — Other Ambulatory Visit: Payer: Self-pay

## 2016-03-27 DIAGNOSIS — G8918 Other acute postprocedural pain: Secondary | ICD-10-CM

## 2016-03-27 MED ORDER — TRAMADOL HCL 50 MG PO TABS: 50.0000 mg | ORAL_TABLET | Freq: Four times a day (QID) | ORAL | Status: DC | PRN

## 2016-03-27 NOTE — Telephone Encounter (Signed)
Tramadol 50 mg RX faxed to patients pharm.

## 2016-03-30 ENCOUNTER — Telehealth: Payer: Self-pay | Admitting: Thoracic Surgery (Cardiothoracic Vascular Surgery)

## 2016-03-30 NOTE — Telephone Encounter (Signed)
Mrs. Lina called to report swelling in area of incision. Firm to touch. No fever or redness. Likely seroma She will apply ice to the area today If worsens will come to the ED for evaluation  Remo Lipps C. Roxan Hockey, MD [facility] [phone]

## 2016-04-04 ENCOUNTER — Other Ambulatory Visit: Payer: Self-pay | Admitting: *Deleted

## 2016-04-04 ENCOUNTER — Encounter: Payer: Self-pay | Admitting: *Deleted

## 2016-04-04 DIAGNOSIS — R222 Localized swelling, mass and lump, trunk: Secondary | ICD-10-CM

## 2016-04-09 ENCOUNTER — Ambulatory Visit (INDEPENDENT_AMBULATORY_CARE_PROVIDER_SITE_OTHER): Payer: Self-pay | Admitting: Thoracic Surgery (Cardiothoracic Vascular Surgery)

## 2016-04-09 ENCOUNTER — Encounter: Payer: Self-pay | Admitting: Thoracic Surgery (Cardiothoracic Vascular Surgery)

## 2016-04-09 ENCOUNTER — Ambulatory Visit
Admission: RE | Admit: 2016-04-09 | Discharge: 2016-04-09 | Disposition: A | Discharge status: Home / Self Care | Payer: Medicare Other | Source: Ambulatory Visit | Attending: Thoracic Surgery (Cardiothoracic Vascular Surgery) | Admitting: Thoracic Surgery (Cardiothoracic Vascular Surgery)

## 2016-04-09 ENCOUNTER — Other Ambulatory Visit: Payer: Self-pay | Admitting: *Deleted

## 2016-04-09 DIAGNOSIS — D3610 Benign neoplasm of peripheral nerves and autonomic nervous system, unspecified: Secondary | ICD-10-CM

## 2016-04-09 DIAGNOSIS — Z09 Encounter for follow-up examination after completed treatment for conditions other than malignant neoplasm: Secondary | ICD-10-CM

## 2016-04-09 DIAGNOSIS — R222 Localized swelling, mass and lump, trunk: Secondary | ICD-10-CM

## 2016-04-09 NOTE — Progress Notes (Signed)
[address]       [place] [address]             [phone]       HPI: Mrs. Garrett returns today for a scheduled postoperative follow-up visit.  She is a 75 year old lady who had a CT scan as part of a workup for a small melanoma of the left eye. The melanoma was treated with localized radiation. CT scan showed a large paravertebral mass in the right thoracic inlet. I resected the mass on 03/25/2016. It turned out to be a benign schwannoma.  She had some swelling initially after surgery. That responded to local treatment. She still has some incisional pain, mostly at night. She is taking tramadol before she goes to bed at night. She does not need it during the day. She doesn't have any other pain or numbness in her neck or right arm.  Past Medical History  Diagnosis Date  . Hypertension   . Hyperlipemia   . Hypothyroidism   . Seasonal allergies   . Paraspinal mass   . PONV (postoperative nausea and vomiting)   . 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 aspirin EC 81 MG tablet Take 81 mg by mouth daily.    Marland Kitchen atorvastatin (LIPITOR) 10 MG tablet Take 10 mg by mouth 2 (two) times a week. Sunday & thursday    . cetirizine (ZYRTEC) 10 MG tablet Take 10 mg by mouth daily as needed for allergies.    . fluticasone (FLONASE) 50 MCG/ACT nasal spray Place 1 spray into both nostrils daily as needed for allergies or rhinitis.    Marland Kitchen levothyroxine (SYNTHROID) 75 MCG tablet Take by mouth daily before breakfast.     . lisinopril (PRINIVIL,ZESTRIL) 5 MG tablet TAKE ONE TABLET BY MOUTH ONE TIME DAILY 30 tablet 0  . Multiple Vitamin (MULTIVITAMIN WITH MINERALS) TABS tablet Take 1 tablet by mouth daily.    . traMADol (ULTRAM) 50 MG tablet Take 1 tablet (50 mg total) by mouth every 6 (six) hours as needed. 40 tablet 0   No current facility-administered medications for this visit.    Physical Exam BP 136/80 mmHg  Pulse 72   Resp 16  Ht 5\' 2"  (1.575 m)  Wt 128 lb (58.06 kg)  BMI 23.41 kg/m2  SpO65 110% 75 year old woman in no acute distress Alert and oriented 3 with no focal motor or sensory deficits Incision healing well  Diagnostic Tests: CHEST 2 VIEW  COMPARISON: 03/25/2016  FINDINGS: Surgical clips overlying the right lung apex with resection of soft tissue mass in the right apex.  The lungs are clear without infiltrate or effusion. No pneumothorax. Mild elevation right hemidiaphragm similar to the prior study.  IMPRESSION: Postop resection right apical mass. No effusion or pneumothorax. Lungs are clear.   Electronically Signed  By: Franchot Gallo M.D.  On: 04/09/2016 12:47 I personally reviewed the chest x-ray her with findings as noted above.  Impression: 75 year old woman who is now 2 weeks out from resection of a schwannoma from the right thoracic inlet via a supraclavicular approach. She is doing extremely well at this time. She has minimal pain. She is anxious to resume full activities.  She may begin driving a limited basis. Appropriate precautions were discussed.  There are no other restrictions on her activities.  She has not seen Dr. Marin Olp since her surgery  Plan: Return in  one year with CT of neck and chest  Melrose Nakayama, MD [facility] [phone]

## 2016-04-10 ENCOUNTER — Other Ambulatory Visit: Payer: Medicare Other

## 2016-04-10 ENCOUNTER — Encounter: Payer: Self-pay | Admitting: Hematology & Oncology

## 2016-04-10 ENCOUNTER — Ambulatory Visit (HOSPITAL_BASED_OUTPATIENT_CLINIC_OR_DEPARTMENT_OTHER): Payer: Medicare Other | Admitting: Hematology & Oncology

## 2016-04-10 VITALS — BP 160/90 | HR 66 | Temp 97.6°F | Resp 18 | Ht 62.0 in | Wt 130.0 lb

## 2016-04-10 DIAGNOSIS — D3610 Benign neoplasm of peripheral nerves and autonomic nervous system, unspecified: Secondary | ICD-10-CM | POA: Diagnosis not present

## 2016-04-10 DIAGNOSIS — C6932 Malignant neoplasm of left choroid: Secondary | ICD-10-CM

## 2016-04-10 NOTE — Progress Notes (Signed)
Hematology and Oncology Follow Up Visit  Olivia Espinoza [id number] 30-Aug-1941 75 y.o. 04/10/2016   Principle Diagnosis:   Schwannoma - Benign  Current Therapy:    Status post resection.     Interim History:  Olivia Espinoza is back for follow-up. We first saw her back in early March. 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  We then got her to surgery. Cardiothoracic surgery went ahead and did a resection on March 27. The pathology report [id number]) showed a benign schwannoma. She had 17 lymph nodes that were all negative. She was in the hospital only a day.  She is feeling well. There is still some slight tenderness at the surgical site. This is healing nicely.  She's had no cough. She's had no nausea or vomiting. She's had no change in bowel or bladder habits. She's had no leg swelling. There's been no joint issues.  She's had no rashes.  Overall, her performance status is ECOG 0.   Medications:  Current outpatient prescriptions:  .  aspirin EC 81 MG tablet, Take 81 mg by mouth daily., Disp: , Rfl:  .  atorvastatin (LIPITOR) 10 MG tablet, Take 10 mg by mouth 2 (two) times a week. Sunday & thursday, Disp: , Rfl:  .  cetirizine (ZYRTEC) 10 MG tablet, Take 10 mg by mouth daily as needed for allergies., Disp: , Rfl:  .  fluticasone (FLONASE) 50 MCG/ACT nasal spray, Place 1 spray into both nostrils daily as needed for allergies or rhinitis., Disp: , Rfl:  .  levothyroxine (SYNTHROID) 75 MCG tablet, Take by mouth daily before breakfast. , Disp: , Rfl:  .  lisinopril (PRINIVIL,ZESTRIL) 5 MG tablet, TAKE ONE TABLET BY MOUTH ONE TIME DAILY, Disp: 30 tablet, Rfl: 0 .  Multiple Vitamin (MULTIVITAMIN WITH MINERALS) TABS tablet, Take 1 tablet by mouth daily., Disp: , Rfl:  .  traMADol (ULTRAM) 50 MG tablet, Take 1 tablet (50 mg total) by mouth every 6 (six) hours as needed., Disp: 40 tablet, Rfl: 0  Allergies:    Allergies  Allergen Reactions  . Chlorhexidine Gluconate Itching  . Levofloxacin Other (See Comments)    Made her feel like someone hit her in the stomach with a bat  . Motrin [Ibuprofen] Hives    Can take aspirin  . Ceclor [Cefaclor] Rash    Prefers amoxicillin per pt    Past Medical History, Surgical history, Social history, and Family History were reviewed and updated.  Review of Systems: As above  Physical Exam:  height is 5\' 2"  (1.575 m) and weight is 130 lb (58.968 kg). Her oral temperature is 97.6 F (36.4 C). Her blood pressure is 160/90 and her pulse is 66. Her respiration is 18.   Wt Readings from Last 3 Encounters:  04/10/16 130 lb (58.968 kg)  04/09/16 128 lb (58.06 kg)  03/21/16 131 lb (59.421 kg)     Well-developed and well-nourished white female in no obvious distress. Head and neck exam shows the right supra-clavicular surgical scar. This is healing well. There is no erythema or tenderness associate with this. There is no swelling. No adenopathy is noted in the neck. Lungs are clear bilaterally. Cardiac exam regular rate and rhythm with no murmurs, rubs or bruits. Abdomen is soft. She has good bowel sounds. There is no fluid wave. There is no palpable liver or spleen tip. Back exam shows no tenderness over the spine, ribs or hips. Extremity  shows no clubbing, cyanosis or edema. She has good range of motion of her joints. Skin exam shows no rashes, ecchymoses or petechia.  Lab Results  Component Value Date   WBC 15.2* 03/26/2016   HGB 11.2* 03/26/2016   HCT 35.3* 03/26/2016   MCV 94.1 03/26/2016   PLT 328 03/26/2016     Chemistry      Component Value Date/Time   NA 138 03/26/2016 0341   NA 135 02/29/2016 1444   K 4.6 03/26/2016 0341   K 3.7 02/29/2016 1444   CL 106 03/26/2016 0341   CL 104 02/29/2016 1444   CO2 25 03/26/2016 0341   CO2 24 02/29/2016 1444   BUN 8 03/26/2016 0341   BUN 19 02/29/2016 1444   CREATININE 0.94 03/26/2016 0341   CREATININE  0.90 02/29/2016 1444   CREATININE 0.99 03/25/2011 1250      Component Value Date/Time   CALCIUM 8.5* 03/26/2016 0341   CALCIUM 9.2 02/29/2016 1444   ALKPHOS 85 03/21/2016 1134   ALKPHOS 94 02/29/2016 1444   AST 30 03/21/2016 1134   AST 25 02/29/2016 1444   ALT 24 03/21/2016 1134   ALT 21 02/29/2016 1444   BILITOT 0.6 03/21/2016 1134   BILITOT 0.4 02/29/2016 1444         Impression and Plan: Olivia Espinoza is A 75 year old white female with a benign schwannoma that was resected. 17 lymph nodes were all negative.  I don't think that there is any role for adjuvant radiation. I don't see that this would provide any defined benefit.  I think that we do need to follow-up with another CT scan in about 6 months. I think a CT scan follow-up every 6 months for 2 years would be reasonable.  I spent about 30 minutes with her. I reviewed the path report with her. She really looks great. She is very appreciative of the great care that she's gotten all throughout her journey with this problem.   Volanda Napoleon, MD 4/12/201711:59 AM

## 2016-08-21 DIAGNOSIS — D3132 Benign neoplasm of left choroid: Secondary | ICD-10-CM | POA: Insufficient documentation

## 2016-10-09 ENCOUNTER — Encounter: Payer: Self-pay | Admitting: Hematology & Oncology

## 2016-10-09 ENCOUNTER — Encounter (HOSPITAL_BASED_OUTPATIENT_CLINIC_OR_DEPARTMENT_OTHER): Payer: Self-pay

## 2016-10-09 ENCOUNTER — Ambulatory Visit: Payer: Medicare Other | Admitting: Hematology & Oncology

## 2016-10-09 ENCOUNTER — Ambulatory Visit (HOSPITAL_BASED_OUTPATIENT_CLINIC_OR_DEPARTMENT_OTHER)
Admission: RE | Admit: 2016-10-09 | Discharge: 2016-10-09 | Disposition: A | Discharge status: Home / Self Care | Payer: Medicare Other | Source: Ambulatory Visit | Attending: Hematology & Oncology | Admitting: Hematology & Oncology

## 2016-10-09 ENCOUNTER — Ambulatory Visit (HOSPITAL_BASED_OUTPATIENT_CLINIC_OR_DEPARTMENT_OTHER): Payer: Medicare Other | Admitting: Hematology & Oncology

## 2016-10-09 ENCOUNTER — Other Ambulatory Visit (HOSPITAL_BASED_OUTPATIENT_CLINIC_OR_DEPARTMENT_OTHER): Payer: Medicare Other

## 2016-10-09 VITALS — BP 124/78 | HR 74 | Temp 97.7°F | Resp 18 | Ht 62.0 in | Wt 125.8 lb

## 2016-10-09 DIAGNOSIS — R918 Other nonspecific abnormal finding of lung field: Secondary | ICD-10-CM | POA: Insufficient documentation

## 2016-10-09 DIAGNOSIS — D3610 Benign neoplasm of peripheral nerves and autonomic nervous system, unspecified: Secondary | ICD-10-CM | POA: Diagnosis not present

## 2016-10-09 DIAGNOSIS — D3614 Benign neoplasm of peripheral nerves and autonomic nervous system of thorax: Secondary | ICD-10-CM | POA: Insufficient documentation

## 2016-10-09 DIAGNOSIS — C6932 Malignant neoplasm of left choroid: Secondary | ICD-10-CM

## 2016-10-09 DIAGNOSIS — C479 Malignant neoplasm of peripheral nerves and autonomic nervous system, unspecified: Secondary | ICD-10-CM

## 2016-10-09 DIAGNOSIS — I700 Atherosclerosis of aorta: Secondary | ICD-10-CM | POA: Insufficient documentation

## 2016-10-09 HISTORY — DX: Malignant (primary) neoplasm, unspecified: C80.1

## 2016-10-09 LAB — CBC WITH DIFFERENTIAL (CANCER CENTER ONLY)
BASO#: 0.1 10*3/uL (ref 0.0–0.2)
BASO%: 0.6 % (ref 0.0–2.0)
EOS%: 3.7 % (ref 0.0–7.0)
Eosinophils Absolute: 0.4 10*3/uL (ref 0.0–0.5)
HEMATOCRIT: 41.5 % (ref 34.8–46.6)
HEMOGLOBIN: 14.1 g/dL (ref 11.6–15.9)
LYMPH#: 2.4 10*3/uL (ref 0.9–3.3)
LYMPH%: 22.4 % (ref 14.0–48.0)
MCH: 31.5 pg (ref 26.0–34.0)
MCHC: 34 g/dL (ref 32.0–36.0)
MCV: 93 fL (ref 81–101)
MONO#: 0.9 10*3/uL (ref 0.1–0.9)
MONO%: 8.2 % (ref 0.0–13.0)
NEUT%: 65.1 % (ref 39.6–80.0)
NEUTROS ABS: 7 10*3/uL — AB (ref 1.5–6.5)
Platelets: 350 10*3/uL (ref 145–400)
RBC: 4.47 10*6/uL (ref 3.70–5.32)
RDW: 13.1 % (ref 11.1–15.7)
WBC: 10.7 10*3/uL — ABNORMAL HIGH (ref 3.9–10.0)

## 2016-10-09 LAB — CMP (CANCER CENTER ONLY)
ALBUMIN: 3.8 g/dL (ref 3.3–5.5)
ALK PHOS: 96 U/L — AB (ref 26–84)
ALT(SGPT): 28 U/L (ref 10–47)
AST: 33 U/L (ref 11–38)
BUN, Bld: 16 mg/dL (ref 7–22)
CALCIUM: 9.6 mg/dL (ref 8.0–10.3)
CO2: 30 mEq/L (ref 18–33)
Chloride: 98 mEq/L (ref 98–108)
Creat: 1.2 mg/dl (ref 0.6–1.2)
Glucose, Bld: 87 mg/dL (ref 73–118)
POTASSIUM: 4.4 meq/L (ref 3.3–4.7)
Sodium: 138 mEq/L (ref 128–145)
Total Bilirubin: 1 mg/dl (ref 0.20–1.60)
Total Protein: 6.7 g/dL (ref 6.4–8.1)

## 2016-10-09 MED ORDER — IOPAMIDOL (ISOVUE-300) INJECTION 61%
75.0000 mL | Freq: Once | INTRAVENOUS | Status: AC | PRN
  Administered 2016-10-09: 64 mL via INTRAVENOUS

## 2016-10-09 NOTE — Progress Notes (Signed)
Hematology and Oncology Follow Up Visit  Jamita Data [id number] 03/26/1941 75 y.o. 10/09/2016   Principle Diagnosis:   Schwannoma - Benign  Current Therapy:    Status post resection.     Interim History:  Olivia Espinoza is back for follow-up. We first saw her back in early March. 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  We then got her to surgery. Cardiothoracic surgery went ahead and did a resection on March 25, 2016. The pathology report [id number]) showed a benign schwannoma. She had 17 lymph nodes that were all negative. She was in the hospital only a day.  She is feeling well. She had a nice summer. I foresee, her mother-in-law, who is 50 years old, is failing in health. She and her husband are spending a lot of time trying to help her.   Her appetite is doing okay. She's had no cough or shortness of breath.   We did do a CT scan today. This is a CT of the chest. There is no evidence of recurrent disease. There is still some slight tenderness at the surgical site. This is healing nicely.  She has been having some intermittent right abdominal discomfort. Nothing on the CT scan looked suspicious. I know this might be irritable bowel. She does see gastroenterology for this.  Overall, her performance status is ECOG 0.   Medications:  Current Outpatient Prescriptions:  .  aspirin EC 81 MG tablet, Take 81 mg by mouth daily., Disp: , Rfl:  .  atorvastatin (LIPITOR) 10 MG tablet, Take 10 mg by mouth 2 (two) times a week. Sunday & thursday, Disp: , Rfl:  .  cetirizine (ZYRTEC) 10 MG tablet, Take 10 mg by mouth daily as needed for allergies., Disp: , Rfl:  .  fluticasone (FLONASE) 50 MCG/ACT nasal spray, Place 1 spray into both nostrils daily as needed for allergies or rhinitis., Disp: , Rfl:  .  levothyroxine (SYNTHROID) 75 MCG tablet, Take by mouth daily before breakfast. , Disp: , Rfl:  .   lisinopril (PRINIVIL,ZESTRIL) 5 MG tablet, TAKE ONE TABLET BY MOUTH ONE TIME DAILY, Disp: 30 tablet, Rfl: 0 .  Multiple Vitamin (MULTIVITAMIN WITH MINERALS) TABS tablet, Take 1 tablet by mouth daily., Disp: , Rfl:   Allergies:  Allergies  Allergen Reactions  . Chlorhexidine Gluconate Itching  . Levofloxacin Other (See Comments)    Made her feel like someone hit her in the stomach with a bat  . Motrin [Ibuprofen] Hives    Can take aspirin  . Rosuvastatin     Other reaction(s): Myalgias (intolerance)  . Ceclor [Cefaclor] Rash    Prefers amoxicillin per pt    Past Medical History, Surgical history, Social history, and Family History were reviewed and updated.  Review of Systems: As above  Physical Exam:  height is 5\' 2"  (1.575 m) and weight is 125 lb 12.8 oz (57.1 kg). Her oral temperature is 97.7 F (36.5 C). Her blood pressure is 124/78 and her pulse is 74. Her respiration is 18.   Wt Readings from Last 3 Encounters:  10/09/16 125 lb 12.8 oz (57.1 kg)  04/10/16 130 lb (59 kg)  04/09/16 128 lb (58.1 kg)     Well-developed and well-nourished white female in no obvious distress. Head and neck exam shows the right supra-clavicular surgical scar. This is healing well. There is no erythema or tenderness associate with this. There is no swelling. No adenopathy  is noted in the neck. Lungs are clear bilaterally. Cardiac exam regular rate and rhythm with no murmurs, rubs or bruits. Abdomen is soft. She has good bowel sounds. There is no fluid wave. There is no palpable liver or spleen tip. Back exam shows no tenderness over the spine, ribs or hips. Extremity shows no clubbing, cyanosis or edema. She has good range of motion of her joints. Skin exam shows no rashes, ecchymoses or petechia.  Lab Results  Component Value Date   WBC 10.7 (H) 10/09/2016   HGB 14.1 10/09/2016   HCT 41.5 10/09/2016   MCV 93 10/09/2016   PLT 350 10/09/2016     Chemistry      Component Value Date/Time   NA  138 10/09/2016 0800   K 4.4 10/09/2016 0800   CL 98 10/09/2016 0800   CO2 30 10/09/2016 0800   BUN 16 10/09/2016 0800   CREATININE 1.2 10/09/2016 0800      Component Value Date/Time   CALCIUM 9.6 10/09/2016 0800   ALKPHOS 96 (H) 10/09/2016 0800   AST 33 10/09/2016 0800   ALT 28 10/09/2016 0800   BILITOT 1.00 10/09/2016 0800         Impression and Plan: Ms. Olivia Espinoza is A 75 year old white female with a benign schwannoma that was resected. 17 lymph nodes were all negative.  IWill order another CT scan we see her back in 6 months. I think this is reasonable to do.  She, as always, is very funny. It was nice talking with her.   Olivia Napoleon, MD 10/11/201710:33 AM

## 2016-12-04 DIAGNOSIS — J019 Acute sinusitis, unspecified: Secondary | ICD-10-CM | POA: Insufficient documentation

## 2016-12-25 DIAGNOSIS — H43812 Vitreous degeneration, left eye: Secondary | ICD-10-CM | POA: Insufficient documentation

## 2017-01-05 DIAGNOSIS — I2510 Atherosclerotic heart disease of native coronary artery without angina pectoris: Secondary | ICD-10-CM | POA: Insufficient documentation

## 2017-01-06 ENCOUNTER — Encounter: Payer: Self-pay | Admitting: Interventional Cardiology

## 2017-01-06 ENCOUNTER — Ambulatory Visit (INDEPENDENT_AMBULATORY_CARE_PROVIDER_SITE_OTHER): Payer: Medicare Other | Admitting: Interventional Cardiology

## 2017-01-06 VITALS — BP 170/98 | HR 79 | Ht 62.0 in | Wt 130.4 lb

## 2017-01-06 DIAGNOSIS — C6930 Malignant neoplasm of unspecified choroid: Secondary | ICD-10-CM | POA: Diagnosis not present

## 2017-01-06 DIAGNOSIS — E7800 Pure hypercholesterolemia, unspecified: Secondary | ICD-10-CM

## 2017-01-06 DIAGNOSIS — I2510 Atherosclerotic heart disease of native coronary artery without angina pectoris: Secondary | ICD-10-CM | POA: Diagnosis not present

## 2017-01-06 DIAGNOSIS — Z86018 Personal history of other benign neoplasm: Secondary | ICD-10-CM

## 2017-01-06 NOTE — Patient Instructions (Addendum)
Medication Instructions:  None  Labwork: None  Testing/Procedures: Your physician has requested that you have en exercise stress myoview. For further information please visit [email or web address]. Please follow instruction sheet, as given.    Follow-Up: Your physician recommends that you schedule a follow-up appointment as needed with Dr. Tamala Julian.  Any Other Special Instructions Will Be Listed Below (If Applicable).  Your LDL (bad cholesterol) needs to be 70 or less.   If you need a refill on your cardiac medications before your next appointment, please call your pharmacy.

## 2017-01-06 NOTE — Progress Notes (Signed)
Cardiology Office Note    Date:  01/06/2017   ID:  Olivia Espinoza, DOB May 28, 1941, MRN [id number]  PCP:  Anthoney Harada, MD  Cardiologist: Sinclair Grooms, MD   Chief Complaint  Patient presents with  . Coronary Artery Disease    History of Present Illness:  Olivia Espinoza is a 76 y.o. female with hyperlipidemia, hypertension, ocular melanoma, and recent history of right upper lung/paraspinal mass resection (biopsy determined schwannoma). For cardiac evaluation because of concern for underlying vascular disease.  She has family history of coronary disease in her brother and father. Mother had a history of atrial fibrillation. Recent resection of a Schwannoma led to identification of diffuse aortic and coronary calcification. This has led to concerned that exertional dyspnea and fatigue could be heart related. She denies chest discomfort. There is no edema. She denies palpitations. She has not had edema. No episodes of syncope or prolonged palpitations.    Past Medical History:  Diagnosis Date  . Cancer ([place])   . Hyperlipemia   . Hypertension   . Hypothyroidism   . 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 Paraspinal mass   . PONV (postoperative nausea and vomiting)   . Seasonal allergies     Past Surgical History:  Procedure Laterality Date  . BTL miscarrage    . RESECTION OF MEDIASTINAL MASS Right 03/25/2016   Procedure: RESECTION OF MASS RIGHT THORACIC INLET VIA SUPRACLAVICULAR APPROACH;  Surgeon: Melrose Nakayama, MD;  Location: [facility];  Service: Thoracic;  Laterality: Right;  . TUBAL LIGATION      Current Medications: Outpatient Medications Prior to Visit  Medication Sig Dispense Refill  . aspirin EC 81 MG tablet Take 81 mg by mouth daily.    Marland Kitchen atorvastatin (LIPITOR) 10 MG tablet Take 10 mg by mouth 2 (two) times a week. Sunday & thursday    . cetirizine (ZYRTEC) 10 MG tablet Take 10 mg by mouth daily as needed for allergies.    . fluticasone (FLONASE) 50 MCG/ACT nasal  spray Place 1 spray into both nostrils daily as needed for allergies or rhinitis.    Marland Kitchen levothyroxine (SYNTHROID) 75 MCG tablet Take by mouth daily before breakfast.     . lisinopril (PRINIVIL,ZESTRIL) 5 MG tablet TAKE ONE TABLET BY MOUTH ONE TIME DAILY 30 tablet 0  . Multiple Vitamin (MULTIVITAMIN WITH MINERALS) TABS tablet Take 1 tablet by mouth daily.     No facility-administered medications prior to visit.      Allergies:   Chlorhexidine gluconate; Levofloxacin; Motrin [ibuprofen]; Rosuvastatin; and Ceclor [cefaclor]   Social History   Social History  . Marital status: Married    Spouse name: N/A  . Number of children: N/A  . Years of education: N/A   Occupational History  . retired     Pharmacist, hospital asst   Social History Main Topics  . Smoking status: Never Smoker  . Smokeless tobacco: Never Used  . Alcohol use 0.0 oz/week     Comment: occassionally  . Drug use: No  . Sexual activity: Not Asked   Other Topics Concern  . None   Social History Narrative  . None     Family History:  The patient's family history includes CVA in her father; Heart attack in her father.   ROS:   Please see the history of present illness.    Vision disturbance, cough, shortness of breath, easy bruising, snoring, wheezing, and irregular heartbeat.  All other systems reviewed and are negative.  PHYSICAL EXAM:   VS:  BP (!) 170/98   Pulse 79   Ht 5\' 2"  (1.575 m)   Wt 130 lb 6.4 oz (59.1 kg)   BMI 23.85 kg/m    GEN: Well nourished, well developed, in no acute distress  HEENT: normal  Neck: no JVD, carotid bruits, or masses Cardiac: RRR; no murmurs, rubs, or gallops,no edema  Respiratory:  clear to auscultation bilaterally, normal work of breathing GI: soft, nontender, nondistended, + BS MS: no deformity or atrophy  Skin: warm and dry, no rash Neuro:  Alert and Oriented x 3, Strength and sensation are intact Psych: euthymic mood, full affect  Wt Readings from Last 3 Encounters:    01/06/17 130 lb 6.4 oz (59.1 kg)  10/09/16 125 lb 12.8 oz (57.1 kg)  04/10/16 130 lb (59 kg)      Studies/Labs Reviewed:   EKG:  EKG  Normal sinus rhythm, atrial abnormality, nonspecific T wave flattening. Poor R-wave progression V1 through V4.  Recent Labs: 10/09/2016: ALT(SGPT) 28; BUN, Bld 16; Creat 1.2; HGB 14.1; Platelets 350; Potassium 4.4; Sodium 138   Lipid Panel    Component Value Date/Time   CHOL 232 (H) 03/25/2011 1250   TRIG 133 03/25/2011 1250   HDL 56 03/25/2011 1250   CHOLHDL 4.1 03/25/2011 1250   VLDL 27 03/25/2011 1250   LDLCALC 149 (H) 03/25/2011 1250    Additional studies/ records that were reviewed today include:  CT scan chest 10/09/16: Cardiovascular: Atherosclerotic calcification of the arterial vasculature, including coronary arteries. Heart size normal. No pericardial effusion.    ASSESSMENT:    1. 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 Hypercholesterolemia   3. Malignant melanoma of choroid, unspecified laterality ([place])   4. History of benign schwannoma      PLAN:  In order of problems listed above:  1. Stress Myoview imaging to exclude significant myocardial perfusion abnormality in this setting where there is diffuse three-vessel coronary artery calcification. The patient has exertional fatigue. 2. Identifying the presence of coronary calcification implies that LDL target should now be less than 70 mg/dL. I would recommend intensification of statin therapy. 3. Status post resection 4. Benign paraspinal tumor resected without complications.    Medication Adjustments/Labs and Tests Ordered: Current medicines are reviewed at length with the patient today.  Concerns regarding medicines are outlined above.  Medication changes, Labs and Tests ordered today are listed in the Patient Instructions below. Patient Instructions  Medication Instructions:  None  Labwork: None  Testing/Procedures: Your physician has requested  that you have en exercise stress myoview. For further information please visit [email or web address]. Please follow instruction sheet, as given.    Follow-Up: Your physician recommends that you schedule a follow-up appointment as needed with Dr. Tamala Julian.  Any Other Special Instructions Will Be Listed Below (If Applicable).  Your LDL (bad cholesterol) needs to be 70 or less.   If you need a refill on your cardiac medications before your next appointment, please call your pharmacy.      Signed, Sinclair Grooms, MD  01/06/2017 5:06 PM    [facility] [address], [place], [place]  [address] Phone: [phone]; Fax: [phone]

## 2017-01-07 ENCOUNTER — Telehealth (HOSPITAL_COMMUNITY): Payer: Self-pay | Admitting: *Deleted

## 2017-01-07 NOTE — Telephone Encounter (Signed)
Patient given detailed instructions per Myocardial Perfusion Study Information Sheet for the test on 01/10/17 at 0915. Patient notified to arrive 15 minutes early and that it is imperative to arrive on time for appointment to keep from having the test rescheduled.  If you need to cancel or reschedule your appointment, please call the office within 24 hours of your appointment. Failure to do so may result in a cancellation of your appointment, and a $50 no show fee. Patient verbalized understanding.Dawon Troop, Ranae [place]

## 2017-01-10 ENCOUNTER — Ambulatory Visit (HOSPITAL_COMMUNITY): Payer: Medicare Other | Attending: Cardiology

## 2017-01-10 DIAGNOSIS — Z8249 Family history of ischemic heart disease and other diseases of the circulatory system: Secondary | ICD-10-CM | POA: Diagnosis not present

## 2017-01-10 DIAGNOSIS — I2510 Atherosclerotic heart disease of native coronary artery without angina pectoris: Secondary | ICD-10-CM

## 2017-01-10 LAB — MYOCARDIAL PERFUSION IMAGING
CHL CUP NUCLEAR SDS: 3
CHL CUP RESTING HR STRESS: 68 {beats}/min
LHR: 0.3
LV dias vol: 45 mL (ref 46–106)
LVSYSVOL: 10 mL
NUC STRESS TID: 1.19
Peak HR: 112 {beats}/min
SRS: 10
SSS: 13

## 2017-01-10 MED ORDER — TECHNETIUM TC 99M TETROFOSMIN IV KIT
31.8000 | PACK | Freq: Once | INTRAVENOUS | Status: AC | PRN
  Administered 2017-01-10: 31.8 via INTRAVENOUS
  Filled 2017-01-10: qty 32

## 2017-01-10 MED ORDER — TECHNETIUM TC 99M TETROFOSMIN IV KIT
10.2000 | PACK | Freq: Once | INTRAVENOUS | Status: AC | PRN
  Administered 2017-01-10: 10.2 via INTRAVENOUS
  Filled 2017-01-10: qty 11

## 2017-01-10 MED ORDER — REGADENOSON 0.4 MG/5ML IV SOLN
0.4000 mg | Freq: Once | INTRAVENOUS | Status: AC
  Administered 2017-01-10: 0.4 mg via INTRAVENOUS

## 2017-01-17 ENCOUNTER — Telehealth: Payer: Self-pay | Admitting: Interventional Cardiology

## 2017-01-17 NOTE — Telephone Encounter (Signed)
New message     Returning your call, not sure what its regarding

## 2017-01-17 NOTE — Telephone Encounter (Signed)
Went over lab results and stress test with pt.  Pt verbalized understanding.

## 2017-03-11 ENCOUNTER — Other Ambulatory Visit: Payer: Self-pay | Admitting: *Deleted

## 2017-03-11 DIAGNOSIS — R221 Localized swelling, mass and lump, neck: Secondary | ICD-10-CM

## 2017-04-08 ENCOUNTER — Other Ambulatory Visit: Payer: Self-pay | Admitting: *Deleted

## 2017-04-08 DIAGNOSIS — C6930 Malignant neoplasm of unspecified choroid: Secondary | ICD-10-CM

## 2017-04-09 ENCOUNTER — Encounter (HOSPITAL_BASED_OUTPATIENT_CLINIC_OR_DEPARTMENT_OTHER): Payer: Self-pay

## 2017-04-09 ENCOUNTER — Other Ambulatory Visit: Payer: Self-pay | Admitting: *Deleted

## 2017-04-09 ENCOUNTER — Ambulatory Visit (HOSPITAL_BASED_OUTPATIENT_CLINIC_OR_DEPARTMENT_OTHER)
Admission: RE | Admit: 2017-04-09 | Discharge: 2017-04-09 | Disposition: A | Discharge status: Home / Self Care | Payer: Medicare Other | Source: Ambulatory Visit | Attending: Hematology & Oncology | Admitting: Hematology & Oncology

## 2017-04-09 ENCOUNTER — Ambulatory Visit (HOSPITAL_BASED_OUTPATIENT_CLINIC_OR_DEPARTMENT_OTHER): Payer: Medicare Other | Admitting: Hematology & Oncology

## 2017-04-09 ENCOUNTER — Other Ambulatory Visit: Payer: Medicare Other

## 2017-04-09 VITALS — BP 160/88 | HR 60 | Temp 98.1°F | Resp 18 | Wt 128.0 lb

## 2017-04-09 DIAGNOSIS — C6930 Malignant neoplasm of unspecified choroid: Secondary | ICD-10-CM

## 2017-04-09 DIAGNOSIS — Z9889 Other specified postprocedural states: Secondary | ICD-10-CM | POA: Diagnosis not present

## 2017-04-09 DIAGNOSIS — Z85820 Personal history of malignant melanoma of skin: Secondary | ICD-10-CM

## 2017-04-09 DIAGNOSIS — Z86018 Personal history of other benign neoplasm: Secondary | ICD-10-CM | POA: Diagnosis not present

## 2017-04-09 DIAGNOSIS — R222 Localized swelling, mass and lump, trunk: Secondary | ICD-10-CM

## 2017-04-09 DIAGNOSIS — R918 Other nonspecific abnormal finding of lung field: Secondary | ICD-10-CM | POA: Diagnosis not present

## 2017-04-09 DIAGNOSIS — C479 Malignant neoplasm of peripheral nerves and autonomic nervous system, unspecified: Secondary | ICD-10-CM | POA: Diagnosis present

## 2017-04-09 LAB — CBC WITH DIFFERENTIAL (CANCER CENTER ONLY)
BASO#: 0.1 10*3/uL (ref 0.0–0.2)
BASO%: 0.7 % (ref 0.0–2.0)
EOS%: 6.1 % (ref 0.0–7.0)
Eosinophils Absolute: 0.6 10*3/uL — ABNORMAL HIGH (ref 0.0–0.5)
HCT: 42.6 % (ref 34.8–46.6)
HGB: 14.2 g/dL (ref 11.6–15.9)
LYMPH#: 2.4 10*3/uL (ref 0.9–3.3)
LYMPH%: 26 % (ref 14.0–48.0)
MCH: 31.4 pg (ref 26.0–34.0)
MCHC: 33.3 g/dL (ref 32.0–36.0)
MCV: 94 fL (ref 81–101)
MONO#: 1 10*3/uL — ABNORMAL HIGH (ref 0.1–0.9)
MONO%: 10.5 % (ref 0.0–13.0)
NEUT#: 5.1 10*3/uL (ref 1.5–6.5)
NEUT%: 56.7 % (ref 39.6–80.0)
PLATELETS: 341 10*3/uL (ref 145–400)
RBC: 4.52 10*6/uL (ref 3.70–5.32)
RDW: 12.8 % (ref 11.1–15.7)
WBC: 9.1 10*3/uL (ref 3.9–10.0)

## 2017-04-09 LAB — CMP (CANCER CENTER ONLY)
ALK PHOS: 87 U/L — AB (ref 26–84)
ALT: 21 U/L (ref 10–47)
AST: 30 U/L (ref 11–38)
Albumin: 3.6 g/dL (ref 3.3–5.5)
BILIRUBIN TOTAL: 1.1 mg/dL (ref 0.20–1.60)
BUN, Bld: 12 mg/dL (ref 7–22)
CALCIUM: 9.5 mg/dL (ref 8.0–10.3)
CO2: 31 meq/L (ref 18–33)
CREATININE: 1.3 mg/dL — AB (ref 0.6–1.2)
Chloride: 98 mEq/L (ref 98–108)
GLUCOSE: 87 mg/dL (ref 73–118)
Potassium: 3.8 mEq/L (ref 3.3–4.7)
SODIUM: 142 meq/L (ref 128–145)
Total Protein: 6.8 g/dL (ref 6.4–8.1)

## 2017-04-09 MED ORDER — IOPAMIDOL (ISOVUE-300) INJECTION 61%
100.0000 mL | Freq: Once | INTRAVENOUS | Status: AC | PRN
  Administered 2017-04-09: 64 mL via INTRAVENOUS

## 2017-04-09 NOTE — Progress Notes (Signed)
Hematology and Oncology Follow Up Visit  Olivia Espinoza [id number] 10/30/1941 76 y.o. 04/09/2017   Principle Diagnosis:   Schwannoma - Benign  Current Therapy:    Status post resection.     Interim History:  Olivia Espinoza is back for follow-up. She is doing quite well. She is excited because another grandchild is on the way. She is looking forward to this.  We did go ahead and get a CT scan of her chest. This was done today. Thankfully, the CT scan did not show any evidence of recurrence. She had the schwannoma resected back in March 2017.  She feels well. She is exercising. She's had no problems with cough or shortness of breath. She's had no rashes. She's had no change in bowel or bladder habits.  She's had no fever.  There's been no bleeding.  Overall, her performance status is Olivia Espinoza 0.   Medications:  Current Outpatient Prescriptions:  .  aspirin EC 81 MG tablet, Take 81 mg by mouth daily., Disp: , Rfl:  .  fluticasone (FLONASE) 50 MCG/ACT nasal spray, Place 1 spray into both nostrils daily as needed for allergies or rhinitis., Disp: , Rfl:  .  levothyroxine (SYNTHROID) 75 MCG tablet, Take by mouth daily before breakfast. , Disp: , Rfl:  .  lisinopril-hydrochlorothiazide (PRINZIDE,ZESTORETIC) 10-12.5 MG tablet, Take 1 tablet by mouth daily., Disp: , Rfl:  .  Multiple Vitamin (MULTIVITAMIN WITH MINERALS) TABS tablet, Take 1 tablet by mouth daily., Disp: , Rfl:   Allergies:  Allergies  Allergen Reactions  . Chlorhexidine Gluconate Itching  . Levofloxacin Other (See Comments)    Made her feel like someone hit her in the stomach with a bat  . Motrin [Ibuprofen] Hives    Can take aspirin  . Rosuvastatin Other (See Comments)    Other reaction(s): Myalgias (intolerance)  . Ceclor [Cefaclor] Rash    Prefers amoxicillin per pt    Past Medical History, Surgical history, Social history, and Family History were reviewed and updated.  Review of Systems: As above  Physical  Exam:  weight is 128 lb (58.1 kg). Her oral temperature is 98.1 F (36.7 C). Her blood pressure is 160/88 (abnormal) and her pulse is 60. Her respiration is 18 and oxygen saturation is 100%.   Wt Readings from Last 3 Encounters:  04/09/17 128 lb (58.1 kg)  01/06/17 130 lb 6.4 oz (59.1 kg)  10/09/16 125 lb 12.8 oz (57.1 kg)     Well-developed and well-nourished white female in no obvious distress. Head and neck exam shows the right supra-clavicular surgical scar. This is healing well. There is no erythema or tenderness associate with this. There is no swelling. No adenopathy is noted in the neck. Lungs are clear bilaterally. Cardiac exam regular rate and rhythm with no murmurs, rubs or bruits. Abdomen is soft. She has good bowel sounds. There is no fluid wave. There is no palpable liver or spleen tip. Back exam shows no tenderness over the spine, ribs or hips. Extremity shows no clubbing, cyanosis or edema. She has good range of motion of her joints. Skin exam shows no rashes, ecchymoses or petechia.  Lab Results  Component Value Date   WBC 9.1 04/09/2017   HGB 14.2 04/09/2017   HCT 42.6 04/09/2017   MCV 94 04/09/2017   PLT 341 04/09/2017     Chemistry      Component Value Date/Time   NA 142 04/09/2017 0800   K 3.8 04/09/2017 0800   CL 98 04/09/2017 0800  CO2 31 04/09/2017 0800   BUN 12 04/09/2017 0800   CREATININE 1.3 (H) 04/09/2017 0800      Component Value Date/Time   CALCIUM 9.5 04/09/2017 0800   ALKPHOS 87 (H) 04/09/2017 0800   AST 30 04/09/2017 0800   ALT 21 04/09/2017 0800   BILITOT 1.10 04/09/2017 0800         Impression and Plan: Olivia Espinoza is A 76 year old white female with a benign schwannoma that was resected. 17 lymph nodes were all negative.  From my point of view, I just don't think we have to see her back. There is no obvious malignancy. I know that she has a past history of ocular melanoma.  She is followed by Dr. Roxan Hockey of thoracic surgery. I'm  sure he will be able to water whatever scans he feels is necessary.  We had good fellowship. It was nice to talk with her. She is happy that she does not have to come back to see [place].   Olivia Napoleon, MD 4/11/20189:45 AM

## 2017-04-22 ENCOUNTER — Other Ambulatory Visit: Payer: Medicare Other

## 2017-04-22 ENCOUNTER — Ambulatory Visit (INDEPENDENT_AMBULATORY_CARE_PROVIDER_SITE_OTHER): Payer: Medicare Other | Admitting: Thoracic Surgery (Cardiothoracic Vascular Surgery)

## 2017-04-22 ENCOUNTER — Encounter: Payer: Self-pay | Admitting: Thoracic Surgery (Cardiothoracic Vascular Surgery)

## 2017-04-22 VITALS — BP 152/94 | HR 77 | Resp 16 | Ht 62.0 in | Wt 128.0 lb

## 2017-04-22 DIAGNOSIS — Z09 Encounter for follow-up examination after completed treatment for conditions other than malignant neoplasm: Secondary | ICD-10-CM

## 2017-04-22 DIAGNOSIS — D3610 Benign neoplasm of peripheral nerves and autonomic nervous system, unspecified: Secondary | ICD-10-CM | POA: Diagnosis not present

## 2017-04-22 NOTE — Progress Notes (Signed)
[address]       [place] [address]             [phone]    HPI:Olivia Espinoza returns today for her one-year follow-up visit  She is a 76 year old woman who had resection of a mass from the right thoracic inlet via a supraclavicular approach in March 2017. Mass turned out to be a benign schwannoma.   She did well postoperatively without any major complications.  She feels well today. She does not have any residual pain from her surgery. She recently saw Dr. Marin Olp, who has released her. Her appetite is good. She's recently changed to a vegan diet because she cannot tolerate statins at a dose that would sufficiently decrease her cholesterol.  Past Medical History:  Diagnosis Date  . Cancer ([place])   . Hyperlipemia   . Hypertension   . Hypothyroidism   . 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 Paraspinal mass   . PONV (postoperative nausea and vomiting)   . Seasonal allergies      Current Outpatient Prescriptions  Medication Sig Dispense Refill  . aspirin EC 81 MG tablet Take 81 mg by mouth daily.    . fluticasone (FLONASE) 50 MCG/ACT nasal spray Place 1 spray into both nostrils daily as needed for allergies or rhinitis.    Marland Kitchen levothyroxine (SYNTHROID) 75 MCG tablet Take by mouth daily before breakfast.     . lisinopril-hydrochlorothiazide (PRINZIDE,ZESTORETIC) 10-12.5 MG tablet Take 1 tablet by mouth daily.    . Multiple Vitamin (MULTIVITAMIN WITH MINERALS) TABS tablet Take 1 tablet by mouth daily.     No current facility-administered medications for this visit.     Physical Exam BP (!) 152/94 (BP Location: Left Arm, Patient Position: Sitting, Cuff Size: Normal)   Pulse 77   Resp 16   Ht 5\' 2"  (1.575 m)   Wt 128 lb (58.1 kg)   SpO2 97% Comment: ON RA  BMI 23.24 kg/m  76 year old woman in no acute distress Alert and oriented 3 with no focal motor or sensory deficits Right supraclavicular scar well-healed Lungs clear with equal breath sounds bilaterally Cardiac  regular rate and rhythm normal S1 and S2  Diagnostic Tests: CT CHEST WITH CONTRAST  TECHNIQUE: Multidetector CT imaging of the chest was performed during intravenous contrast administration.  CONTRAST:  78mL ISOVUE-300 IOPAMIDOL (ISOVUE-300) INJECTION 61%  COMPARISON:  10/09/2016  FINDINGS: Cardiovascular: No significant vascular findings. Normal heart size. No pericardial effusion.  Mediastinum/Nodes: No enlarged mediastinal, hilar, or axillary lymph nodes. Thyroid gland, trachea, and esophagus demonstrate no significant findings.  Lungs/Pleura: No pleural effusion. Cluster of tree-in-bud nodules within the inferior and anterior aspect of the right middle lobe are likely post infectious/inflammatory, image 111 of series 3.  Upper Abdomen: No acute abnormality.  Musculoskeletal: No chest wall abnormality. No acute or significant osseous findings.  IMPRESSION: 1. No specific findings identified to suggest residual or recurrent tumor. Stable postoperative changes in the right supraclavicular region. 2. Clustered nodules within the right middle lobe are likely post infectious or inflammatory in etiology.   Electronically Signed   By: Kerby Moors M.D.   On: 04/09/2017 09:29 I personally reviewed the CT chest and concur with the findings noted above  Impression: Olivia Espinoza is a 76 year old woman who had a mass resected from her right thoracic inlet that turned out to be a schwannoma. Her surgery was about a year ago. She is doing well from the surgery with  no evidence of recurrence. Although this was a benign lesion there is some risk of recurrence due to the inability to get wide margins on the tumor and she will need continued follow-up.  Lung nodules- has a tree-in-bud pattern in the right middle lobe likely infectious or inflammatory in nature. We'll follow those on CT as well.  Plan: Return in one year with CT chest and neck  I spent 15 minutes with  Olivia Espinoza during this visit  Melrose Nakayama, MD [facility] [phone]

## 2017-07-09 DIAGNOSIS — H3589 Other specified retinal disorders: Secondary | ICD-10-CM | POA: Insufficient documentation

## 2017-07-09 DIAGNOSIS — T66XXXA Radiation sickness, unspecified, initial encounter: Secondary | ICD-10-CM | POA: Insufficient documentation

## 2017-08-03 IMAGING — MR MR THORACIC SPINE WO/W CM
4 of 10 series · 14 of 48 positions shown · IV contrast (multihance)
Comparison: [HOSPITAL] chest CT 11/28/2015, [HOSPITAL] chest
radiographs 11/28/2015. [HOSPITAL] semi upright portable
chest radiograph [id number].

CLINICAL DATA: 75-year-old female with malignant melanoma.
Paraspinal mass discovered on outside chest CT. Subsequent
encounter.

Creatinine was obtained on site at [HOSPITAL] at [HOSPITAL].
Results: Creatinine 1.0 mg/dL.
EXAM:
MRI THORACIC SPINE WITHOUT AND WITH CONTRAST
TECHNIQUE: Multiplanar and multiecho pulse sequences of the thoracic spine were
obtained without and with intravenous contrast.
CONTRAST:  10mL MULTIHANCE GADOBENATE DIMEGLUMINE 529 MG/ML IV SOLN

[Series 5: T2 · sagittal · 3.0mm · 0.47mm/px · 5 of 21 slices shown (1 of 2)]
[im 1/21]
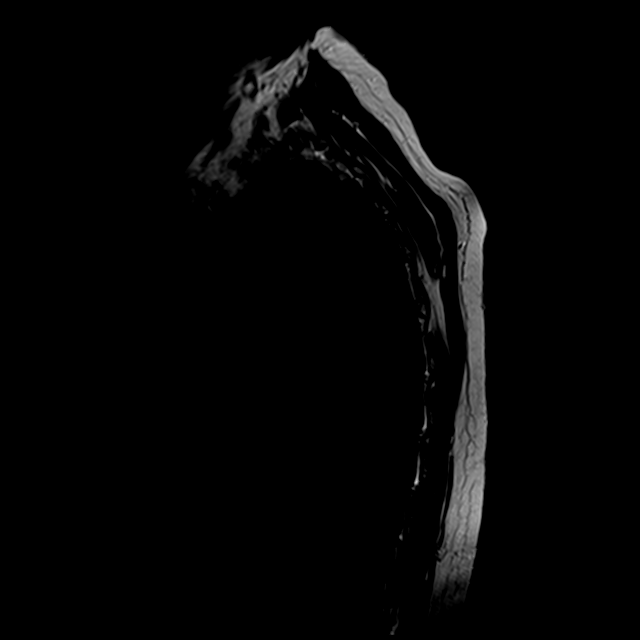
[im 5/21]
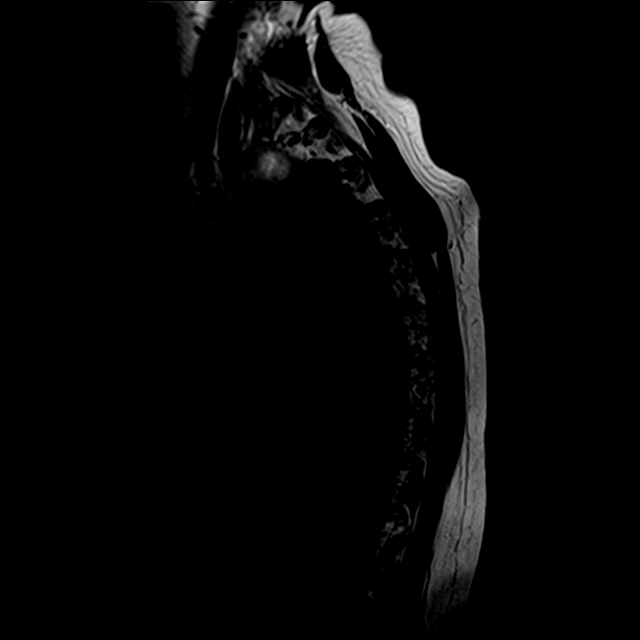
[im 9/21]
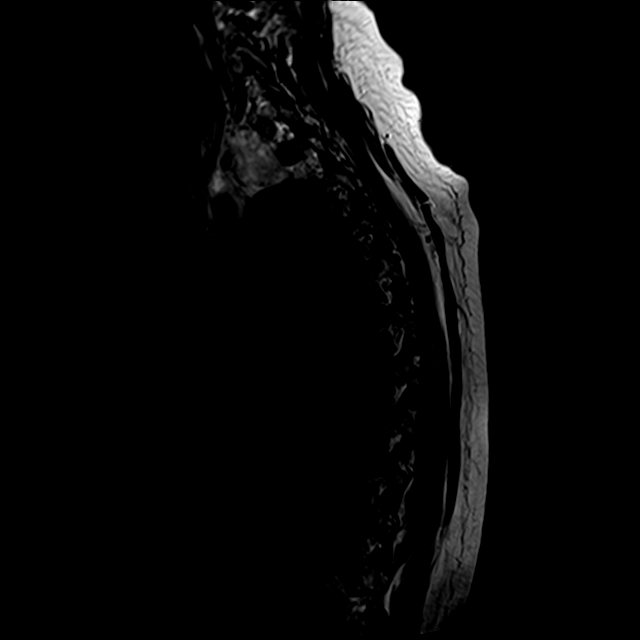
[im 13/21]
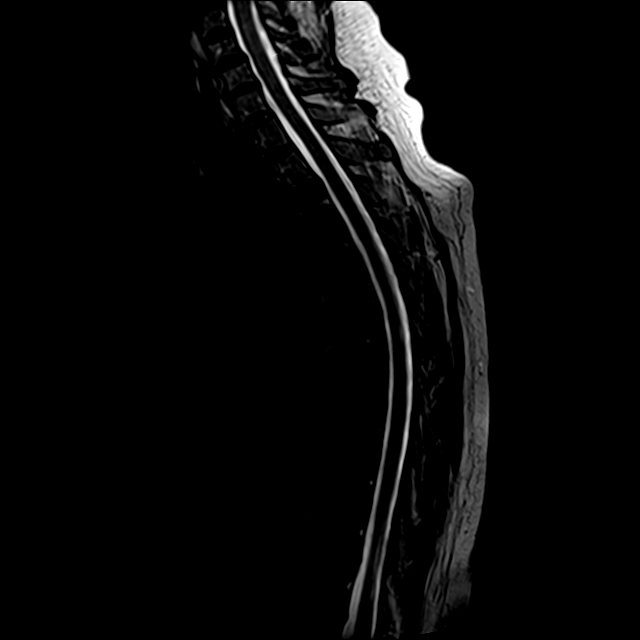
[im 21/21]
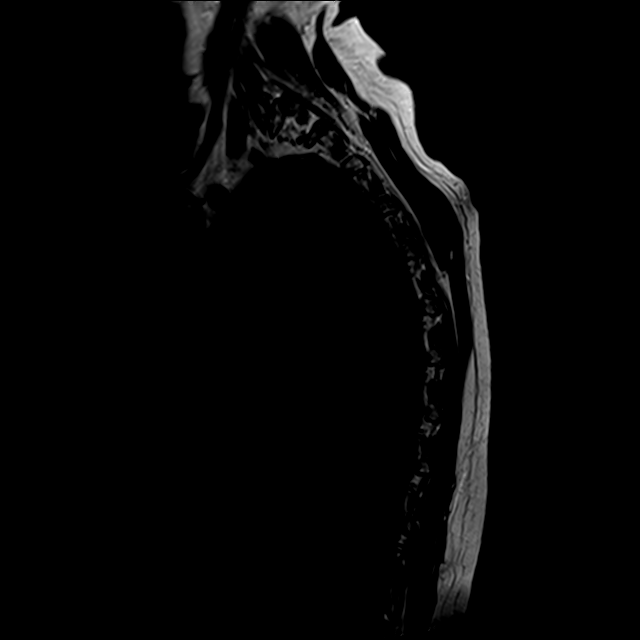

[Series 7: T1 · sagittal · 3.0mm · 0.47mm/px · 3 of 21 slices shown (1 of 2)]
[im 1/21]
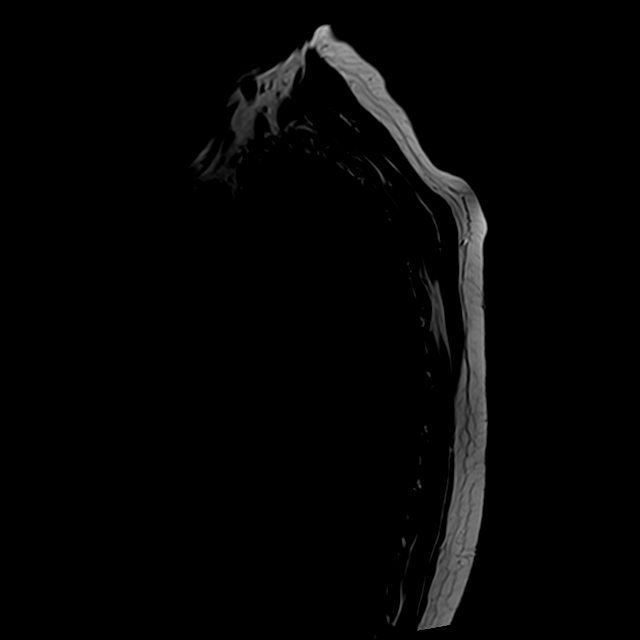
[im 11/21]
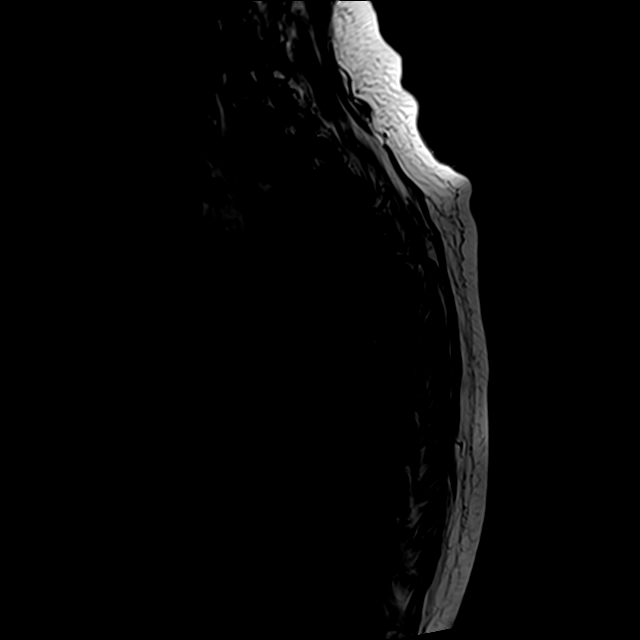
[im 21/21]
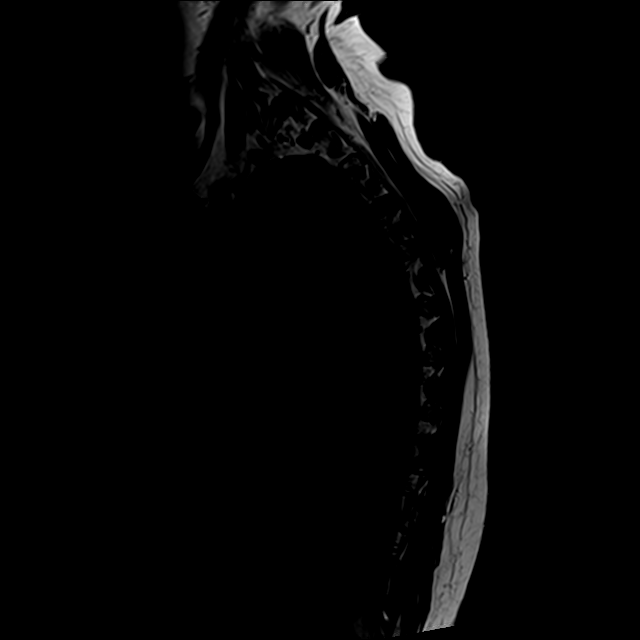

[Series 9: T1 · axial · 4.0mm · 0.39mm/px · z∈[-117,-38]mm · 3 of 17 slices shown (2 of 2)]
[im 1/17]
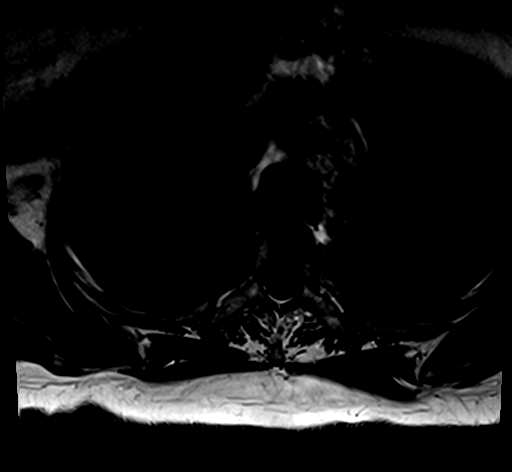
[im 11/17]
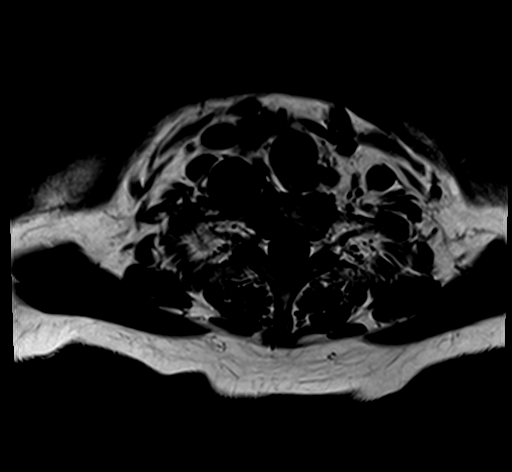
[im 17/17]
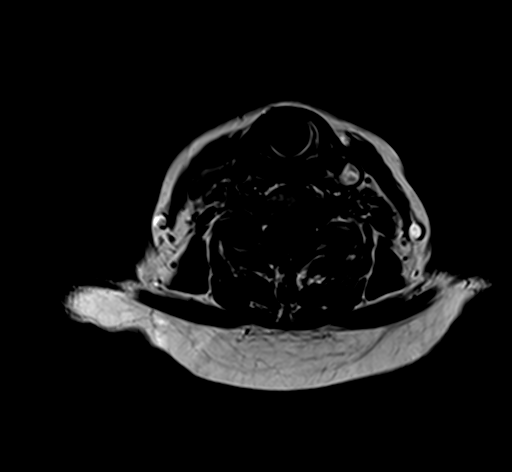

[Series 11: T2 · axial · 4.0mm · 0.39mm/px · z∈[-117,-38]mm · 3 of 17 slices shown (2 of 2)]
[im 1/17]
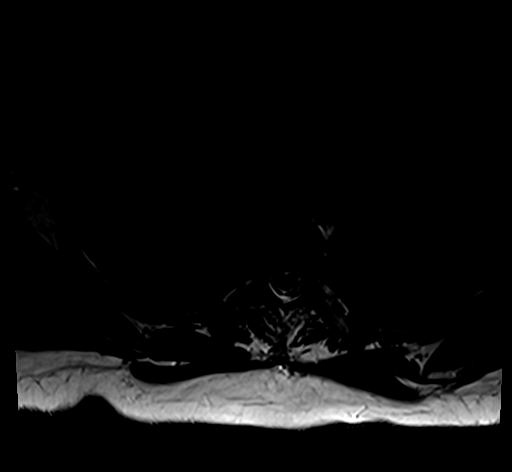
[im 11/17]
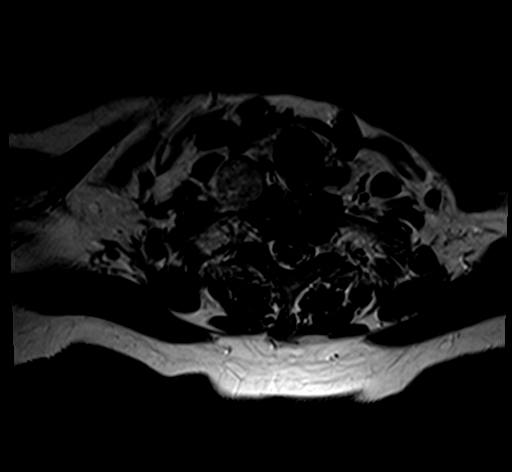
[im 17/17]
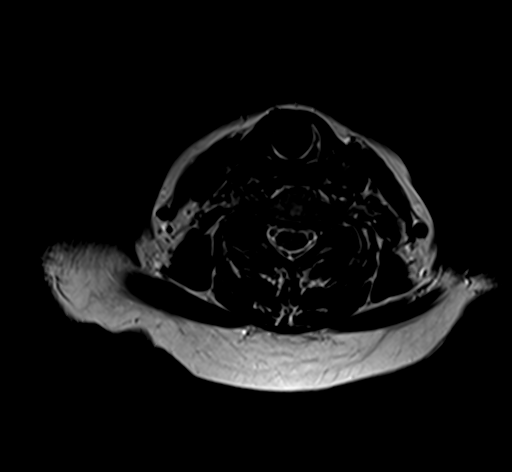

[14 of 48 positions shown; findings below may reference images not displayed]

FINDINGS: Limited sagittal imaging of the cervical spine remarkable for mild
degenerative appearing spondylolisthesis at C5-C6. Visible cervical
spine bone marrow signal appears normal.

The oval right paraspinal mass is re- demonstrated adjacent to the
right anterolateral T1-T2 level. This encompasses 25 x 36 x 42 mm,
and intensely enhances following contrast. Maximum dimension was
approximately 37 mm in [REDACTED]. It does not appear to be intimately
associated with the right upper thoracic neural foramina, but rather
is nearest the right T1 and T2 costovertebral junctions. No
associated bone invasion or adjacent marrow edema is identified. The
lesion is immediately deep to the right subclavian artery at the
level of the right vertebral artery origin (series 11, image 9). The
right V1 segment of the vertebral artery is mildly displaced due to
the mass. The lesion is immediately lateral to the right wall of the
trachea at the thoracic inlet. The mass appears to be in the
superior extrapleural space. The right lung apex otherwise appears
normal. There is no surrounding soft tissue inflammation.

Underlying thoracic vertebral height, alignment, and marrow signal
is normal. No marrow edema or evidence of acute osseous abnormality.
No abnormal thoracic spine enhancement. The other thoracic
paraspinal soft tissues are within normal limits.

Normal thoracic spinal canal patency. Spinal cord signal is within
normal limits at all visualized levels. No dural thickening or
enhancement. No abnormal intradural enhancement. The conus
medullaris is not included, occurs below the T11-T12 level.

Other visualized thoracic viscera appear within normal limits.
IMPRESSION: 1. Solitary 4.2 cm intensely enhancing right thoracic inlet
paraspinal mass. This appears mildly enlarged since [REDACTED]. Also,
it is not intimately associated with the upper thoracic neural
foramina, arguing against a chronic nerve sheath tumor.
This abuts the right T1 and T2 costovertebral junctions, but there
is no associated bone invasion or marrow edema. No surrounding
inflammation. Note the adjacent right right subclavian and proximal
vertebral arteries.
2. Otherwise negative thoracic spine.

## 2018-03-18 ENCOUNTER — Other Ambulatory Visit: Payer: Self-pay | Admitting: *Deleted

## 2018-03-18 DIAGNOSIS — D3610 Benign neoplasm of peripheral nerves and autonomic nervous system, unspecified: Secondary | ICD-10-CM

## 2018-04-14 ENCOUNTER — Encounter: Payer: Medicare Other | Admitting: Thoracic Surgery (Cardiothoracic Vascular Surgery)

## 2018-04-14 ENCOUNTER — Other Ambulatory Visit: Payer: Medicare Other

## 2018-05-05 ENCOUNTER — Encounter: Payer: Medicare Other | Admitting: Thoracic Surgery (Cardiothoracic Vascular Surgery)

## 2018-05-05 ENCOUNTER — Other Ambulatory Visit: Payer: Medicare Other

## 2018-06-09 ENCOUNTER — Encounter: Payer: Self-pay | Admitting: Thoracic Surgery (Cardiothoracic Vascular Surgery)

## 2018-06-09 ENCOUNTER — Ambulatory Visit: Payer: Medicare Other | Admitting: Thoracic Surgery (Cardiothoracic Vascular Surgery)

## 2018-06-09 ENCOUNTER — Ambulatory Visit
Admission: RE | Admit: 2018-06-09 | Discharge: 2018-06-09 | Disposition: A | Discharge status: Home / Self Care | Payer: Medicare Other | Source: Ambulatory Visit | Attending: Thoracic Surgery (Cardiothoracic Vascular Surgery) | Admitting: Thoracic Surgery (Cardiothoracic Vascular Surgery)

## 2018-06-09 VITALS — BP 150/70 | HR 74 | Resp 16 | Ht 62.0 in | Wt 115.8 lb

## 2018-06-09 DIAGNOSIS — R911 Solitary pulmonary nodule: Secondary | ICD-10-CM

## 2018-06-09 DIAGNOSIS — D3610 Benign neoplasm of peripheral nerves and autonomic nervous system, unspecified: Secondary | ICD-10-CM

## 2018-06-09 DIAGNOSIS — R221 Localized swelling, mass and lump, neck: Secondary | ICD-10-CM | POA: Diagnosis not present

## 2018-06-09 DIAGNOSIS — Z09 Encounter for follow-up examination after completed treatment for conditions other than malignant neoplasm: Secondary | ICD-10-CM

## 2018-06-09 NOTE — Progress Notes (Signed)
[address]       [place] [address]             [phone]     HPI: Olivia Espinoza returns for scheduled follow-up visit.  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  It was a benign schwannoma.  She did well postoperatively.  Saw her in the office a year ago.  That was her one year follow-up.  She was doing well at that time.  She had just changed to a vegan diet to help control her cholesterol.  In the interim since her last visit she is been doing well.  She says she feels well.  She is only has pain in the area of the incision when the barometric pressure first drops.  She is lost 20 pounds with her diet.  Past Medical History:  Diagnosis Date  . Cancer ([place])   . Hyperlipemia   . Hypertension   . Hypothyroidism   . 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 Paraspinal mass   . PONV (postoperative nausea and vomiting)   . Seasonal allergies       Current Outpatient Medications  Medication Sig Dispense Refill  . aspirin EC 81 MG tablet Take 81 mg by mouth daily.    . fluticasone (FLONASE) 50 MCG/ACT nasal spray Place 1 spray into both nostrils daily as needed for allergies or rhinitis.    Marland Kitchen levothyroxine (SYNTHROID) 75 MCG tablet Take by mouth daily before breakfast.     . lisinopril-hydrochlorothiazide (PRINZIDE,ZESTORETIC) 20-12.5 MG tablet Take 1 tablet by mouth daily.    . Multiple Vitamin (MULTIVITAMIN WITH MINERALS) TABS tablet Take 1 tablet by mouth daily.     No current facility-administered medications for this visit.     Physical Exam BP (!) 150/70 (BP Location: Right Arm, Patient Position: Sitting, Cuff Size: Normal)   Pulse 74   Resp 16   Ht 5\' 2"  (1.575 m)   Wt 115 lb 12.8 oz (52.5 kg)   SpO2 98% Comment: RA  BMI 21.41 kg/m  77 year old woman in no acute distress Alert and oriented x3 no focal deficits Incision well-healed No cervical or  subclavicular adenopathy Cardiac regular rate and rhythm normal S1-S2 Lungs clear with equal breath sounds bilaterally  Diagnostic Tests: CT CHEST WITHOUT CONTRAST  TECHNIQUE: Multidetector CT imaging of the chest was performed following the standard protocol without IV contrast.  COMPARISON:  Chest CT 04/09/2017.  FINDINGS: Cardiovascular: Heart size is normal. There is no significant pericardial fluid, thickening or pericardial calcification. There is aortic atherosclerosis, as well as atherosclerosis of the great vessels of the mediastinum and the coronary arteries, including calcified atherosclerotic plaque in the left anterior descending, left circumflex and right coronary arteries.  Mediastinum/Nodes: Area of soft tissue prominence and surgical clips in the right supraclavicular region, very similar to prior examinations, related to prior schwannoma resection. This is poorly evaluated on today's noncontrast CT examination but appears grossly similar to priors, without definitive findings to strongly suggest recurrence. No pathologically enlarged mediastinal or hilar lymph nodes. Please note that accurate exclusion of hilar adenopathy is limited on noncontrast CT scans. Esophagus is unremarkable in appearance. No axillary lymphadenopathy.  Lungs/Pleura: No suspicious appearing pulmonary nodules or masses are noted. No acute consolidative airspace disease. No pleural effusions.  Upper Abdomen: Aortic atherosclerosis.  Musculoskeletal: There are no aggressive appearing lytic or  blastic lesions noted in the visualized portions of the skeleton.  IMPRESSION: 1. Postoperative changes in the right supraclavicular region appear similar to prior examinations related to resected schwannoma. No definite findings to suggest local recurrence. 2. No acute findings in the thorax. 3. Aortic atherosclerosis, in addition to three-vessel coronary artery disease. Assessment for  potential risk factor modification, dietary therapy or pharmacologic therapy may be warranted, if clinically indicated.  Aortic Atherosclerosis (ICD10-I70.0).   Electronically Signed   By: Vinnie Langton M.D.   On: 06/09/2018 14:12 I personally reviewed the CT images and concur with the findings noted above.  Impression: Olivia Espinoza is a 77 year old woman who had a benign schwannoma resected from the right thoracic inlet 2 years ago.  She has no evidence of recurrent disease.  Aortic atherosclerosis-cholesterol down 40 points with vegan diet.  Unable to tolerate statin.  Managed by Dr. Jacelyn Grip.  "Tree-in-bud" lung nodules-noted on CT scan a year ago and felt likely to be inflammatory.  Resolved.  Plan: No further follow-up necessary.  I will be happy to see Olivia Espinoza back anytime if I can be of any further assistance with her care in the future  Melrose Nakayama, MD [facility] [phone]

## 2019-07-07 DIAGNOSIS — H468 Other optic neuritis: Secondary | ICD-10-CM | POA: Insufficient documentation

## 2020-01-11 ENCOUNTER — Ambulatory Visit: Payer: Medicare PPO | Attending: Internal Medicine

## 2020-01-11 DIAGNOSIS — Z20822 Contact with and (suspected) exposure to covid-19: Secondary | ICD-10-CM

## 2020-01-13 LAB — NOVEL CORONAVIRUS, NAA: SARS-CoV-2, NAA: NOT DETECTED

## 2020-07-25 DIAGNOSIS — D3132 Benign neoplasm of left choroid: Secondary | ICD-10-CM | POA: Diagnosis not present

## 2020-07-25 DIAGNOSIS — H25813 Combined forms of age-related cataract, bilateral: Secondary | ICD-10-CM | POA: Diagnosis not present

## 2020-07-25 DIAGNOSIS — T66XXXS Radiation sickness, unspecified, sequela: Secondary | ICD-10-CM | POA: Diagnosis not present

## 2020-07-25 DIAGNOSIS — T66XXXD Radiation sickness, unspecified, subsequent encounter: Secondary | ICD-10-CM | POA: Diagnosis not present

## 2020-07-25 DIAGNOSIS — H43812 Vitreous degeneration, left eye: Secondary | ICD-10-CM | POA: Diagnosis not present

## 2020-07-25 DIAGNOSIS — C6932 Malignant neoplasm of left choroid: Secondary | ICD-10-CM | POA: Diagnosis not present

## 2020-07-25 DIAGNOSIS — H3589 Other specified retinal disorders: Secondary | ICD-10-CM | POA: Diagnosis not present

## 2020-07-25 DIAGNOSIS — H468 Other optic neuritis: Secondary | ICD-10-CM | POA: Diagnosis not present

## 2020-07-27 DIAGNOSIS — R300 Dysuria: Secondary | ICD-10-CM | POA: Diagnosis not present

## 2020-07-27 DIAGNOSIS — R32 Unspecified urinary incontinence: Secondary | ICD-10-CM | POA: Diagnosis not present

## 2020-08-08 DIAGNOSIS — J069 Acute upper respiratory infection, unspecified: Secondary | ICD-10-CM | POA: Diagnosis not present

## 2020-08-08 DIAGNOSIS — R21 Rash and other nonspecific skin eruption: Secondary | ICD-10-CM | POA: Diagnosis not present

## 2020-08-08 DIAGNOSIS — J029 Acute pharyngitis, unspecified: Secondary | ICD-10-CM | POA: Diagnosis not present

## 2020-08-21 DIAGNOSIS — Z1231 Encounter for screening mammogram for malignant neoplasm of breast: Secondary | ICD-10-CM | POA: Diagnosis not present

## 2020-08-21 DIAGNOSIS — D225 Melanocytic nevi of trunk: Secondary | ICD-10-CM | POA: Diagnosis not present

## 2020-08-21 DIAGNOSIS — Z1283 Encounter for screening for malignant neoplasm of skin: Secondary | ICD-10-CM | POA: Diagnosis not present

## 2020-08-21 DIAGNOSIS — X32XXXD Exposure to sunlight, subsequent encounter: Secondary | ICD-10-CM | POA: Diagnosis not present

## 2020-08-21 DIAGNOSIS — L570 Actinic keratosis: Secondary | ICD-10-CM | POA: Diagnosis not present

## 2020-09-01 DIAGNOSIS — Z20822 Contact with and (suspected) exposure to covid-19: Secondary | ICD-10-CM | POA: Diagnosis not present

## 2020-09-04 DIAGNOSIS — Z20822 Contact with and (suspected) exposure to covid-19: Secondary | ICD-10-CM | POA: Diagnosis not present

## 2020-09-11 DIAGNOSIS — I7090 Unspecified atherosclerosis: Secondary | ICD-10-CM | POA: Diagnosis not present

## 2020-09-11 DIAGNOSIS — E785 Hyperlipidemia, unspecified: Secondary | ICD-10-CM | POA: Diagnosis not present

## 2020-09-11 DIAGNOSIS — M810 Age-related osteoporosis without current pathological fracture: Secondary | ICD-10-CM | POA: Diagnosis not present

## 2020-09-11 DIAGNOSIS — I10 Essential (primary) hypertension: Secondary | ICD-10-CM | POA: Diagnosis not present

## 2020-09-11 DIAGNOSIS — E039 Hypothyroidism, unspecified: Secondary | ICD-10-CM | POA: Diagnosis not present

## 2020-09-11 DIAGNOSIS — R5383 Other fatigue: Secondary | ICD-10-CM | POA: Diagnosis not present

## 2020-10-11 DIAGNOSIS — I10 Essential (primary) hypertension: Secondary | ICD-10-CM | POA: Diagnosis not present

## 2020-10-11 DIAGNOSIS — M810 Age-related osteoporosis without current pathological fracture: Secondary | ICD-10-CM | POA: Diagnosis not present

## 2020-10-11 DIAGNOSIS — E039 Hypothyroidism, unspecified: Secondary | ICD-10-CM | POA: Diagnosis not present

## 2020-10-11 DIAGNOSIS — N390 Urinary tract infection, site not specified: Secondary | ICD-10-CM | POA: Diagnosis not present

## 2020-10-11 DIAGNOSIS — I7090 Unspecified atherosclerosis: Secondary | ICD-10-CM | POA: Diagnosis not present

## 2020-10-11 DIAGNOSIS — R5383 Other fatigue: Secondary | ICD-10-CM | POA: Diagnosis not present

## 2020-10-11 DIAGNOSIS — E785 Hyperlipidemia, unspecified: Secondary | ICD-10-CM | POA: Diagnosis not present

## 2020-12-18 DIAGNOSIS — J22 Unspecified acute lower respiratory infection: Secondary | ICD-10-CM | POA: Diagnosis not present

## 2020-12-18 DIAGNOSIS — J101 Influenza due to other identified influenza virus with other respiratory manifestations: Secondary | ICD-10-CM | POA: Diagnosis not present

## 2020-12-18 DIAGNOSIS — R5383 Other fatigue: Secondary | ICD-10-CM | POA: Diagnosis not present

## 2020-12-18 DIAGNOSIS — Z03818 Encounter for observation for suspected exposure to other biological agents ruled out: Secondary | ICD-10-CM | POA: Diagnosis not present

## 2021-01-11 DIAGNOSIS — E785 Hyperlipidemia, unspecified: Secondary | ICD-10-CM | POA: Diagnosis not present

## 2021-01-11 DIAGNOSIS — I7090 Unspecified atherosclerosis: Secondary | ICD-10-CM | POA: Diagnosis not present

## 2021-01-11 DIAGNOSIS — E039 Hypothyroidism, unspecified: Secondary | ICD-10-CM | POA: Diagnosis not present

## 2021-01-11 DIAGNOSIS — M810 Age-related osteoporosis without current pathological fracture: Secondary | ICD-10-CM | POA: Diagnosis not present

## 2021-01-11 DIAGNOSIS — I10 Essential (primary) hypertension: Secondary | ICD-10-CM | POA: Diagnosis not present

## 2021-01-25 DIAGNOSIS — L570 Actinic keratosis: Secondary | ICD-10-CM | POA: Diagnosis not present

## 2021-01-25 DIAGNOSIS — X32XXXD Exposure to sunlight, subsequent encounter: Secondary | ICD-10-CM | POA: Diagnosis not present

## 2021-01-25 DIAGNOSIS — L821 Other seborrheic keratosis: Secondary | ICD-10-CM | POA: Diagnosis not present

## 2021-01-25 DIAGNOSIS — Z85820 Personal history of malignant melanoma of skin: Secondary | ICD-10-CM | POA: Diagnosis not present

## 2021-01-25 DIAGNOSIS — Z1283 Encounter for screening for malignant neoplasm of skin: Secondary | ICD-10-CM | POA: Diagnosis not present

## 2021-01-25 DIAGNOSIS — Z08 Encounter for follow-up examination after completed treatment for malignant neoplasm: Secondary | ICD-10-CM | POA: Diagnosis not present

## 2021-02-06 DIAGNOSIS — C6932 Malignant neoplasm of left choroid: Secondary | ICD-10-CM | POA: Diagnosis not present

## 2021-02-06 DIAGNOSIS — H25813 Combined forms of age-related cataract, bilateral: Secondary | ICD-10-CM | POA: Diagnosis not present

## 2021-02-06 DIAGNOSIS — T66XXXD Radiation sickness, unspecified, subsequent encounter: Secondary | ICD-10-CM | POA: Diagnosis not present

## 2021-02-06 DIAGNOSIS — H3589 Other specified retinal disorders: Secondary | ICD-10-CM | POA: Diagnosis not present

## 2021-02-06 DIAGNOSIS — H43812 Vitreous degeneration, left eye: Secondary | ICD-10-CM | POA: Diagnosis not present

## 2021-02-07 DIAGNOSIS — H40013 Open angle with borderline findings, low risk, bilateral: Secondary | ICD-10-CM | POA: Diagnosis not present

## 2021-04-05 DIAGNOSIS — B078 Other viral warts: Secondary | ICD-10-CM | POA: Diagnosis not present

## 2021-04-30 DIAGNOSIS — L570 Actinic keratosis: Secondary | ICD-10-CM | POA: Diagnosis not present

## 2021-04-30 DIAGNOSIS — X32XXXD Exposure to sunlight, subsequent encounter: Secondary | ICD-10-CM | POA: Diagnosis not present

## 2021-05-21 ENCOUNTER — Encounter: Payer: Self-pay | Admitting: Podiatry

## 2021-05-21 ENCOUNTER — Ambulatory Visit: Payer: Self-pay | Admitting: Podiatry

## 2021-05-21 ENCOUNTER — Other Ambulatory Visit: Payer: Self-pay

## 2021-05-21 DIAGNOSIS — R0989 Other specified symptoms and signs involving the circulatory and respiratory systems: Secondary | ICD-10-CM | POA: Insufficient documentation

## 2021-05-21 DIAGNOSIS — L500 Allergic urticaria: Secondary | ICD-10-CM | POA: Insufficient documentation

## 2021-05-21 DIAGNOSIS — I10 Essential (primary) hypertension: Secondary | ICD-10-CM | POA: Insufficient documentation

## 2021-05-21 DIAGNOSIS — D3610 Benign neoplasm of peripheral nerves and autonomic nervous system, unspecified: Secondary | ICD-10-CM | POA: Insufficient documentation

## 2021-05-21 DIAGNOSIS — M8580 Other specified disorders of bone density and structure, unspecified site: Secondary | ICD-10-CM | POA: Insufficient documentation

## 2021-05-21 DIAGNOSIS — Z1210 Encounter for screening for malignant neoplasm of intestinal tract, unspecified: Secondary | ICD-10-CM | POA: Insufficient documentation

## 2021-05-21 DIAGNOSIS — I7090 Unspecified atherosclerosis: Secondary | ICD-10-CM | POA: Insufficient documentation

## 2021-05-21 DIAGNOSIS — J302 Other seasonal allergic rhinitis: Secondary | ICD-10-CM | POA: Insufficient documentation

## 2021-05-21 DIAGNOSIS — K5730 Diverticulosis of large intestine without perforation or abscess without bleeding: Secondary | ICD-10-CM | POA: Insufficient documentation

## 2021-05-21 DIAGNOSIS — R918 Other nonspecific abnormal finding of lung field: Secondary | ICD-10-CM | POA: Insufficient documentation

## 2021-05-21 DIAGNOSIS — C6990 Malignant neoplasm of unspecified site of unspecified eye: Secondary | ICD-10-CM | POA: Insufficient documentation

## 2021-05-21 DIAGNOSIS — D2371 Other benign neoplasm of skin of right lower limb, including hip: Secondary | ICD-10-CM

## 2021-05-21 DIAGNOSIS — R748 Abnormal levels of other serum enzymes: Secondary | ICD-10-CM | POA: Insufficient documentation

## 2021-05-21 DIAGNOSIS — I8390 Asymptomatic varicose veins of unspecified lower extremity: Secondary | ICD-10-CM | POA: Insufficient documentation

## 2021-05-21 DIAGNOSIS — R5383 Other fatigue: Secondary | ICD-10-CM | POA: Insufficient documentation

## 2021-05-21 DIAGNOSIS — B829 Intestinal parasitism, unspecified: Secondary | ICD-10-CM | POA: Insufficient documentation

## 2021-05-21 DIAGNOSIS — L84 Corns and callosities: Secondary | ICD-10-CM | POA: Diagnosis not present

## 2021-05-21 DIAGNOSIS — M810 Age-related osteoporosis without current pathological fracture: Secondary | ICD-10-CM | POA: Insufficient documentation

## 2021-05-21 DIAGNOSIS — R109 Unspecified abdominal pain: Secondary | ICD-10-CM | POA: Insufficient documentation

## 2021-05-21 NOTE — Patient Instructions (Signed)
Keep the bandage on for 24 hours. At that time, remove and clean with soap and water. If it hurts or burns before 24 hours go ahead and remove the bandage and wash with soap and water. Keep the area clean. If there is any blistering cover with antibiotic ointment and a bandage. Monitor for any redness, drainage, or other signs of infection. Call the office if any are to occur. If you have any questions, please call the office at [phone].  

## 2021-05-23 NOTE — Progress Notes (Signed)
Subjective:   Patient ID: Olivia Espinoza, female   DOB: 80 y.o.   MRN: [id number]   HPI 80 year old female presents the office with concerns of a skin lesion on the plantar aspect of the right foot.  She said the dermatologist.  About 1 month ago with minimal improvement.  She has been also trying to apply offloading pads to the area.  Denies any redness or drainage or any swelling to the area.  The area is tender with pressure.  She has no other concerns today.   Review of Systems  All other systems reviewed and are negative.  Past Medical History:  Diagnosis Date  . Cancer ([place])   . Hyperlipemia   . Hypertension   . Hypothyroidism   . 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 Paraspinal mass   . PONV (postoperative nausea and vomiting)   . Seasonal allergies     Past Surgical History:  Procedure Laterality Date  . BTL miscarrage    . RESECTION OF MEDIASTINAL MASS Right 03/25/2016   Procedure: RESECTION OF MASS RIGHT THORACIC INLET VIA SUPRACLAVICULAR APPROACH;  Surgeon: Melrose Nakayama, MD;  Location: [facility];  Service: Thoracic;  Laterality: Right;  . TUBAL LIGATION       Current Outpatient Medications:  .  aspirin EC 81 MG tablet, Take 81 mg by mouth daily., Disp: , Rfl:  .  fluticasone (FLONASE) 50 MCG/ACT nasal spray, Place 1 spray into both nostrils daily as needed for allergies or rhinitis., Disp: , Rfl:  .  levothyroxine (SYNTHROID) 75 MCG tablet, Take by mouth daily before breakfast. , Disp: , Rfl:  .  lisinopril-hydrochlorothiazide (PRINZIDE,ZESTORETIC) 20-12.5 MG tablet, Take 1 tablet by mouth daily., Disp: , Rfl:  .  Multiple Vitamin (MULTIVITAMIN WITH MINERALS) TABS tablet, Take 1 tablet by mouth daily., Disp: , Rfl:   Allergies  Allergen Reactions  . Chlorhexidine Gluconate Itching  . Levofloxacin Other (See Comments)    Made her feel like someone hit her in the stomach with a bat  . Motrin [Ibuprofen] Hives    Can take aspirin  . Rosuvastatin Other (See Comments)    Other  reaction(s): Myalgias (intolerance)  . Ceclor [Cefaclor] Rash    Prefers amoxicillin per pt        Objective:  Physical Exam  General: AAO x3, NAD  Dermatological: Hyperkeratotic lesion right foot submetatarsal 2.  Upon debridement there is no underlying ulceration drainage or any signs of infection.  Negative for, puncture wound or foreign body.  Vascular: Dorsalis Pedis artery and Posterior Tibial artery pedal pulses are 2/4 bilateral with immedate capillary fill time. There is no pain with calf compression, swelling, warmth, erythema.   Neruologic: Grossly intact via light touch bilateral.   Musculoskeletal: Tenderness to hyperkeratotic lesion but no other areas of discomfort.  Muscular strength 5/5 in all groups tested bilateral.  Gait: Unassisted, Nonantalgic.       Assessment:   Skin lesion right foot     Plan:  -Treatment options discussed including all alternatives, risks, and complications -Etiology of symptoms were discussed -Sharply debrided the hyperkeratotic lesion without any complications or bleeding.  Discussed with alcohol and pad was placed followed by salicylic acid and a bandage.  Post procedure instructions discussed.  Monitor for any signs or symptoms of infection.  She tolerated well. -Discussed moisturizer, offloading to help prevent or decrease recurrence  Trula Slade DPM

## 2021-07-23 ENCOUNTER — Other Ambulatory Visit: Payer: Self-pay

## 2021-07-23 ENCOUNTER — Encounter: Payer: Self-pay | Admitting: Podiatry

## 2021-07-23 ENCOUNTER — Ambulatory Visit: Payer: Medicare PPO | Admitting: Podiatry

## 2021-07-23 DIAGNOSIS — D2371 Other benign neoplasm of skin of right lower limb, including hip: Secondary | ICD-10-CM

## 2021-07-23 NOTE — Patient Instructions (Signed)
Keep the bandage on for 24 hours. At that time, remove and clean with soap and water. If it hurts or burns before 24 hours go ahead and remove the bandage and wash with soap and water. Keep the area clean. If there is any blistering cover with antibiotic ointment and a bandage. Monitor for any redness, drainage, or other signs of infection. Call the office if any are to occur. If you have any questions, please call the office at [phone].  

## 2021-07-26 DIAGNOSIS — X32XXXD Exposure to sunlight, subsequent encounter: Secondary | ICD-10-CM | POA: Diagnosis not present

## 2021-07-26 DIAGNOSIS — Z08 Encounter for follow-up examination after completed treatment for malignant neoplasm: Secondary | ICD-10-CM | POA: Diagnosis not present

## 2021-07-26 DIAGNOSIS — L570 Actinic keratosis: Secondary | ICD-10-CM | POA: Diagnosis not present

## 2021-07-26 DIAGNOSIS — Z1283 Encounter for screening for malignant neoplasm of skin: Secondary | ICD-10-CM | POA: Diagnosis not present

## 2021-07-26 DIAGNOSIS — Z85820 Personal history of malignant melanoma of skin: Secondary | ICD-10-CM | POA: Diagnosis not present

## 2021-07-26 DIAGNOSIS — L821 Other seborrheic keratosis: Secondary | ICD-10-CM | POA: Diagnosis not present

## 2021-07-26 DIAGNOSIS — B070 Plantar wart: Secondary | ICD-10-CM | POA: Diagnosis not present

## 2021-07-26 NOTE — Progress Notes (Signed)
Subjective: 80 year old female presents the office today for concerns of reoccurrence of a skin lesion along her right foot.  She states that she is continued discomfort of the area.  No swelling or redness or any drainage.  No significant changes. Denies any systemic complaints such as fevers, chills, nausea, vomiting. No acute changes since last appointment, and no other complaints at this time.   Objective: AAO x3, NAD DP/PT pulses palpable bilaterally, CRT less than 3 seconds On the right foot along the submetatarsal 2 areas annular hyperkeratotic lesion.  There is no pinpoint bleeding or evidence of verruca.  No evidence of foreign body.  No edema, erythema or any signs of infection.  Tenderness palpation prior to debridement.  No other areas of discomfort.  No open lesions or pre-ulcerative lesions.  No pain with calf compression, swelling, warmth, erythema  Assessment: 80 year old female with skin lesion right foot  Plan: -All treatment options discussed with the patient including all alternatives, risks, complications.  -Sharply debrided the lesion which is likely porokeratosis.  Cleaned area with alcohol and a pad was placed followed by salicylic acid and a bandage.  Post procedure instructions discussed.  Monitor for any signs or symptoms of infection. -Discussed moisturizer and offloading. -Patient encouraged to call the office with any questions, concerns, change in symptoms.   Trula Slade DPM

## 2021-08-02 ENCOUNTER — Telehealth: Payer: Self-pay | Admitting: *Deleted

## 2021-08-02 ENCOUNTER — Other Ambulatory Visit: Payer: Self-pay

## 2021-08-02 ENCOUNTER — Other Ambulatory Visit: Payer: Medicare PPO

## 2021-08-02 NOTE — Telephone Encounter (Signed)
Visit completed, met.applied to inserts.08/02/21

## 2021-08-07 DIAGNOSIS — C6932 Malignant neoplasm of left choroid: Secondary | ICD-10-CM | POA: Diagnosis not present

## 2021-08-14 DIAGNOSIS — C6932 Malignant neoplasm of left choroid: Secondary | ICD-10-CM | POA: Diagnosis not present

## 2021-08-14 DIAGNOSIS — H43812 Vitreous degeneration, left eye: Secondary | ICD-10-CM | POA: Diagnosis not present

## 2021-08-14 DIAGNOSIS — T66XXXS Radiation sickness, unspecified, sequela: Secondary | ICD-10-CM | POA: Diagnosis not present

## 2021-08-14 DIAGNOSIS — H25813 Combined forms of age-related cataract, bilateral: Secondary | ICD-10-CM | POA: Diagnosis not present

## 2021-08-14 DIAGNOSIS — H3589 Other specified retinal disorders: Secondary | ICD-10-CM | POA: Diagnosis not present

## 2021-08-17 ENCOUNTER — Other Ambulatory Visit: Payer: Medicare PPO

## 2021-08-20 DIAGNOSIS — M810 Age-related osteoporosis without current pathological fracture: Secondary | ICD-10-CM | POA: Diagnosis not present

## 2021-08-20 DIAGNOSIS — I7090 Unspecified atherosclerosis: Secondary | ICD-10-CM | POA: Diagnosis not present

## 2021-08-20 DIAGNOSIS — E039 Hypothyroidism, unspecified: Secondary | ICD-10-CM | POA: Diagnosis not present

## 2021-08-20 DIAGNOSIS — F5101 Primary insomnia: Secondary | ICD-10-CM | POA: Diagnosis not present

## 2021-08-20 DIAGNOSIS — Z1211 Encounter for screening for malignant neoplasm of colon: Secondary | ICD-10-CM | POA: Diagnosis not present

## 2021-08-20 DIAGNOSIS — I10 Essential (primary) hypertension: Secondary | ICD-10-CM | POA: Diagnosis not present

## 2021-08-20 DIAGNOSIS — E785 Hyperlipidemia, unspecified: Secondary | ICD-10-CM | POA: Diagnosis not present

## 2021-09-14 DIAGNOSIS — Z1231 Encounter for screening mammogram for malignant neoplasm of breast: Secondary | ICD-10-CM | POA: Diagnosis not present

## 2021-09-20 DIAGNOSIS — R928 Other abnormal and inconclusive findings on diagnostic imaging of breast: Secondary | ICD-10-CM | POA: Diagnosis not present

## 2021-11-01 DIAGNOSIS — E785 Hyperlipidemia, unspecified: Secondary | ICD-10-CM | POA: Diagnosis not present

## 2021-11-01 DIAGNOSIS — I7090 Unspecified atherosclerosis: Secondary | ICD-10-CM | POA: Diagnosis not present

## 2021-11-01 DIAGNOSIS — F5101 Primary insomnia: Secondary | ICD-10-CM | POA: Diagnosis not present

## 2021-11-01 DIAGNOSIS — I10 Essential (primary) hypertension: Secondary | ICD-10-CM | POA: Diagnosis not present

## 2021-11-01 DIAGNOSIS — E039 Hypothyroidism, unspecified: Secondary | ICD-10-CM | POA: Diagnosis not present

## 2021-11-01 DIAGNOSIS — M810 Age-related osteoporosis without current pathological fracture: Secondary | ICD-10-CM | POA: Diagnosis not present

## 2021-12-05 DIAGNOSIS — E785 Hyperlipidemia, unspecified: Secondary | ICD-10-CM | POA: Diagnosis not present

## 2021-12-05 DIAGNOSIS — E039 Hypothyroidism, unspecified: Secondary | ICD-10-CM | POA: Diagnosis not present

## 2021-12-12 DIAGNOSIS — Z1211 Encounter for screening for malignant neoplasm of colon: Secondary | ICD-10-CM | POA: Diagnosis not present

## 2021-12-13 DIAGNOSIS — E785 Hyperlipidemia, unspecified: Secondary | ICD-10-CM | POA: Diagnosis not present

## 2021-12-13 DIAGNOSIS — I10 Essential (primary) hypertension: Secondary | ICD-10-CM | POA: Diagnosis not present

## 2021-12-13 DIAGNOSIS — M810 Age-related osteoporosis without current pathological fracture: Secondary | ICD-10-CM | POA: Diagnosis not present

## 2021-12-13 DIAGNOSIS — F5101 Primary insomnia: Secondary | ICD-10-CM | POA: Diagnosis not present

## 2021-12-13 DIAGNOSIS — I7090 Unspecified atherosclerosis: Secondary | ICD-10-CM | POA: Diagnosis not present

## 2021-12-13 DIAGNOSIS — E039 Hypothyroidism, unspecified: Secondary | ICD-10-CM | POA: Diagnosis not present

## 2021-12-13 DIAGNOSIS — R0789 Other chest pain: Secondary | ICD-10-CM | POA: Diagnosis not present

## 2021-12-14 ENCOUNTER — Other Ambulatory Visit: Payer: Self-pay

## 2021-12-14 ENCOUNTER — Encounter: Payer: Self-pay | Admitting: Cardiovascular Disease

## 2021-12-14 ENCOUNTER — Ambulatory Visit: Payer: Medicare PPO | Admitting: Cardiovascular Disease

## 2021-12-14 VITALS — BP 148/62 | HR 84 | Ht 61.5 in | Wt 118.4 lb

## 2021-12-14 DIAGNOSIS — E7800 Pure hypercholesterolemia, unspecified: Secondary | ICD-10-CM

## 2021-12-14 DIAGNOSIS — I700 Atherosclerosis of aorta: Secondary | ICD-10-CM

## 2021-12-14 DIAGNOSIS — I2510 Atherosclerotic heart disease of native coronary artery without angina pectoris: Secondary | ICD-10-CM

## 2021-12-14 DIAGNOSIS — R072 Precordial pain: Secondary | ICD-10-CM | POA: Diagnosis not present

## 2021-12-14 MED ORDER — METOPROLOL TARTRATE 50 MG PO TABS: ORAL_TABLET | ORAL | 0 refills | Status: DC

## 2021-12-14 NOTE — Progress Notes (Signed)
Cardiology Office Note:    Date:  12/17/2021   ID:  Olivia Espinoza, DOB 10-12-1941, MRN [id number]  PCP:  Vernie Shanks, MD   [facility] Providers Cardiologist:  Sanda Klein, MD     Referring MD: Vernie Shanks, MD   Chief Complaint  Patient presents with   Consult  Olivia Espinoza is a 80 y.o. female who is being seen today for the evaluation of coronary artery calcification at the request of Vernie Shanks, MD.   History of Present Illness:    Olivia Espinoza is a 80 y.o. female with a hx of hypercholesterolemia, hypertension, treated hypothyroidism and a strong family history of coronary disease.  About 5 days prior to this appointment she had an episode of "dull chest ache" which she attributed to drinking too much coffee and having indigestion.  It lasted for about 2 hours and resolved spontaneously.  It has not recurred since.  It was not associated with physical activity.  She denies exertional angina or dyspnea, orthopnea, PND, claudication, leg edema, syncope, palpitations, focal neurological events cough or wheezing.  Her father had a myocardial infarction at age 38, her brother had bypass surgery at age 12.  Another brother who was always very healthy, a triathlete and very lean, ended up having bypass surgery at age 31.  The patient has tried taking statins for hypercholesterolemia but developed leg cramps with simvastatin and generalized myalgia with rosuvastatin.  She had a normal nuclear stress test in 2018.  This was performed because a chest CT described aortic atherosclerosis and coronary artery calcifications.  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 Roxan Hockey in 2017.  Her most recent lipid profile performed just a week ago showed an LDL  cholesterol of 137, while taking monotherapy with ezetimibe 10 mg once daily.  Last performed in 2012, when I assume she was not on any therapy, showed a total cholesterol 232 and an LDL cholesterol of 149.  Her LDL is always good in the high 50s-60 range.  She has normal triglycerides.  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   aspirin EC 81 MG tablet Take 81 mg by mouth daily.   ezetimibe (ZETIA) 10 MG tablet Take 1 tablet by mouth daily.   levothyroxine (SYNTHROID) 75 MCG tablet Take by mouth daily before breakfast.    melatonin 5 MG TABS Take 5 mg by mouth at bedtime as needed.   metoprolol tartrate (LOPRESSOR) 50 MG tablet Take one tablet two hours prior to the test   Multiple Vitamin (MULTIVITAMIN  WITH MINERALS) TABS tablet Take 1 tablet by mouth daily.     Allergies:   Chlorhexidine gluconate, Levofloxacin, Motrin [ibuprofen], Rosuvastatin, Ciprofloxacin, Ezetimibe, Lisinopril, and Ceclor [cefaclor]   Social History   Socioeconomic History   Marital status: Married    Spouse name: Not on file   Number of children: Not on file   Years of education: Not on file   Highest education level: Not on file  Occupational History   Occupation: retired    Comment: Pharmacist, hospital asst  Tobacco Use   Smoking status: Never   Smokeless tobacco: Never  Substance and Sexual Activity    Alcohol use: Yes    Alcohol/week: 0.0 standard drinks    Comment: occassionally   Drug use: No   Sexual activity: Not on file  Other Topics Concern   Not on file  Social History Narrative   Not on file   Social Determinants of Health   Financial Resource Strain: Not on file  Food Insecurity: Not on file  Transportation Needs: Not on file  Physical Activity: Not on file  Stress: Not on file  Social Connections: Not on file     Family History: The patient's family history includes CVA in her father; Heart attack in her father.  Her father's heart attack occurred at age 90.  2 of her brothers had bypass surgery, 1 at age 2, the other at age 70  ROS:   Please see the history of present illness.     All other systems reviewed and are negative.  EKGs/Labs/Other Studies Reviewed:    The following studies were reviewed today: Lexiscan Myoview 01/10/2017  Nuclear stress EF: 78%. The study is normal. This is a low risk study. The left ventricular ejection fraction is hyperdynamic (>65%). There was no ST segment deviation noted during stress.   Extracardiac activity obscures the mid and basal inferior wall No other infarct/ischemia EF normal 78%  Low risk study   CT chest June 09, 2018 1. Postoperative changes in the right supraclavicular region appear similar to prior examinations related to resected schwannoma. No definite findings to suggest local recurrence. 2. No acute findings in the thorax. 3. Aortic atherosclerosis, in addition to three-vessel coronary artery disease. Assessment for potential risk factor modification, dietary therapy or pharmacologic therapy may be warranted, if clinically indicated.    EKG:  EKG is  ordered today.  The ekg ordered today demonstrates normal sinus rhythm, subtle ST segment depression less than 1 mm in the inferior leads, very similar to findings on ECGs from 2018 and 2012.  Recent Labs: No results found for requested labs  within last 8760 hours.  Recent Lipid Panel    Component Value Date/Time   CHOL 232 (H) 03/25/2011 1250   TRIG 133 03/25/2011 1250   HDL 56 03/25/2011 1250   CHOLHDL 4.1 03/25/2011 1250   VLDL 27 03/25/2011 1250   LDLCALC 149 (H) 03/25/2011 1250   12/05/2021 Cholesterol 217, HDL 60, LDL 137, triglycerides 110 Creatinine 0.94, potassium 4.8, ALT 14, TSH 1.77  Risk Assessment/Calculations:        Physical Exam:    VS:  BP (!) 148/62 (BP Location: Left Arm, Patient Position: Sitting, Cuff Size: Normal)    Pulse 84    Ht 5' 1.5" (1.562 m)    Wt 118 lb 6.4 oz (53.7 kg)    SpO2 98%    BMI 22.01 kg/m     Wt Readings from Last 3 Encounters:  12/14/21 118 lb 6.4 oz (  53.7 kg)  06/09/18 115 lb 12.8 oz (52.5 kg)  04/22/17 128 lb (58.1 kg)     GEN:  Well nourished, well developed in no acute distress HEENT: Normal NECK: No JVD; No carotid bruits LYMPHATICS: No lymphadenopathy CARDIAC: RRR, no murmurs, rubs, gallops RESPIRATORY:  Clear to auscultation without rales, wheezing or rhonchi  ABDOMEN: Soft, non-tender, non-distended MUSCULOSKELETAL:  No edema; No deformity  SKIN: Warm and dry NEUROLOGIC:  Alert and oriented x 3 PSYCHIATRIC:  Normal affect   ASSESSMENT:    1. Precordial pain   2. Coronary artery calcification seen on CAT scan   3. Atherosclerosis of aorta ([place])   4. Hypercholesterolemia    PLAN:    In order of problems listed above:  Chest pain at rest: Atypical, hard to say whether it was GI or coronary in etiology.  Occurred about 5 days ago and she is currently asymptomatic.  ECG does not show any changes.  We will schedule for coronary CT angiography.  In the meantime, advised to seek emergency room attention for similar complaints the last for 30 minutes or longer.  Reasonable to take aspirin 81 mg daily until we clarify the situation. Coronary and aortic atherosclerosis: Previous imaging studies were performed with a normal gated technique.  We will get a  coronary CT angiogram.  Her risk factors include moderately severe hypercholesterolemia and a strong family history of early onset CAD as well as hypertension. HTN: Blood pressure is slightly high today.  Adding a beta-blocker might be the optimal intervention since this will help with gait quality of the CT images and may help with what could be angina pectoris. HLP: She has had  side effects with both daily simvastatin and daily rosuvastatin.  We could try rosuvastatin on an intermittent schedule such as twice a week with coenzyme every 10.  However, if she has significant CAD or very high calcium score that will be insufficient to bring her down to a target LDL less than 70.  We could decide to use Repatha or Praluent if that is the case.  She is already lean, has a healthy diet and overall healthy lifestyle.      Medication Adjustments/Labs and Tests Ordered: Current medicines are reviewed at length with the patient today.  Concerns regarding medicines are outlined above.  Orders Placed This Encounter  Procedures   CT CORONARY MORPH W/CTA COR W/SCORE W/CA W/CM &/OR WO/CM   Meds ordered this encounter  Medications   metoprolol tartrate (LOPRESSOR) 50 MG tablet    Sig: Take one tablet two hours prior to the test    Dispense:  1 tablet    Refill:  0    Patient Instructions  Medication Instructions:  No changes *If you need a refill on your cardiac medications before your next appointment, please call your pharmacy*   Lab Work: None ordered If you have labs (blood work) drawn today and your tests are completely normal, you will receive your results only by: MyChart Message (if you have MyChart) OR A paper copy in the mail If you have any lab test that is abnormal or we need to change your treatment, we will call you to review the results.   Testing/Procedures: None ordered   Follow-Up: At [facility], you and your health needs are our priority.  As part of our continuing  mission to provide you with exceptional heart care, we have created designated Provider Care Teams.  These Care Teams include your primary Cardiologist (physician) and  Advanced Practice Providers (APPs -  Physician Assistants and Nurse Practitioners) who all work together to provide you with the care you need, when you need it.  We recommend signing up for the patient portal called "MyChart".  Sign up information is provided on this After Visit Summary.  MyChart is used to connect with patients for Virtual Visits (Telemedicine).  Patients are able to view lab/test results, encounter notes, upcoming appointments, etc.  Non-urgent messages can be sent to your provider as well.   To learn more about what you can do with MyChart, go to [email or web address].    Your next appointment:   3 month(s)  The format for your next appointment:   In Person  Provider:   Sanda Klein, MD  Other Instructions   Your cardiac CT will be scheduled at one of the below locations:   [facility] [address] [place], [place] [address] [phone]  [facility] [address] [place], [place] [address] [phone]  If scheduled at [facility], please arrive at the [facility] main entrance (entrance A) of [facility] 30 minutes prior to test start time. You can use the FREE valet parking offered at the main entrance (encouraged to control the heart rate for the test) Proceed to the [facility] Radiology Department (first floor) to check-in and test prep.  If scheduled at [facility], please arrive 15 mins early for check-in and test prep.  Please follow these instructions carefully (unless otherwise directed):  On the Night Before the Test: Be sure to Drink plenty of water. Do not consume any caffeinated/decaffeinated beverages or chocolate 12 hours prior to your test. Do not take any  antihistamines 12 hours prior to your test.  On the Day of the Test: Drink plenty of water until 1 hour prior to the test. Do not eat any food 4 hours prior to the test. You may take your regular medications prior to the test.  Take metoprolol (Lopressor) two hours prior to test. HOLD Lisinopril-Hydrochlorothiazide morning of the test. FEMALES- please wear underwire-free bra if available, avoid dresses & tight clothing  After the Test: Drink plenty of water. After receiving IV contrast, you may experience a mild flushed feeling. This is normal. On occasion, you may experience a mild rash up to 24 hours after the test. This is not dangerous. If this occurs, you can take Benadryl 25 mg and increase your fluid intake. If you experience trouble breathing, this can be serious. If it is severe call 911 IMMEDIATELY. If it is mild, please call our office. If you take any of these medications: Glipizide/Metformin, Avandament, Glucavance, please do not take 48 hours after completing test unless otherwise instructed.  Please allow 2-4 weeks for scheduling of routine cardiac CTs. Some insurance companies require a pre-authorization which may delay scheduling of this test.   For non-scheduling related questions, please contact the cardiac imaging nurse navigator should you have any questions/concerns: Marchia Bond, Cardiac Imaging Nurse Navigator Gordy Clement, Cardiac Imaging Nurse Navigator [facility] Direct Office Dial: [phone]   For scheduling needs, including cancellations and rescheduling, please call [place], [phone].     Signed, Sanda Klein, MD  12/17/2021 9:09 PM    [place]

## 2021-12-14 NOTE — Patient Instructions (Addendum)
Medication Instructions:  No changes *If you need a refill on your cardiac medications before your next appointment, please call your pharmacy*   Lab Work: None ordered If you have labs (blood work) drawn today and your tests are completely normal, you will receive your results only by: [place] (if you have MyChart) OR A paper copy in the mail If you have any lab test that is abnormal or we need to change your treatment, we will call you to review the results.   Testing/Procedures: None ordered   Follow-Up: At [facility], you and your health needs are our priority.  As part of our continuing mission to provide you with exceptional heart care, we have created designated Provider Care Teams.  These Care Teams include your primary Cardiologist (physician) and Advanced Practice Providers (APPs -  Physician Assistants and Nurse Practitioners) who all work together to provide you with the care you need, when you need it.  We recommend signing up for the patient portal called "MyChart".  Sign up information is provided on this After Visit Summary.  MyChart is used to connect with patients for Virtual Visits (Telemedicine).  Patients are able to view lab/test results, encounter notes, upcoming appointments, etc.  Non-urgent messages can be sent to your provider as well.   To learn more about what you can do with MyChart, go to [email or web address].    Your next appointment:   3 month(s)  The format for your next appointment:   In Person  Provider:   Sanda Klein, MD  Other Instructions   Your cardiac CT will be scheduled at one of the below locations:   [facility] [address] [place], [place] [address] [phone]  [place] [address] [place], [place] [address] [phone]  If scheduled at [facility], please arrive at the [facility] main entrance (entrance A) of [facility] 30 minutes prior to test start time. You can use the FREE valet parking offered at the main entrance (encouraged to control the heart rate for the test) Proceed to the [facility] Radiology Department (first floor) to check-in and test prep.  If scheduled at [facility], please arrive 15 mins early for check-in and test prep.  Please follow these instructions carefully (unless otherwise directed):  On the Night Before the Test: Be sure to Drink plenty of water. Do not consume any caffeinated/decaffeinated beverages or chocolate 12 hours prior to your test. Do not take any antihistamines 12 hours prior to your test.  On the Day of the Test: Drink plenty of water until 1 hour prior to the test. Do not eat any food 4 hours prior to the test. You may take your regular medications prior to the test.  Take metoprolol (Lopressor) two hours prior to test. HOLD Lisinopril-Hydrochlorothiazide morning of the test. FEMALES- please wear underwire-free bra if available, avoid dresses & tight clothing  After the Test: Drink plenty of water. After receiving IV contrast, you may experience a mild flushed feeling. This is normal. On occasion, you may experience a mild rash up to 24 hours after the test. This is not dangerous. If this occurs, you can take Benadryl 25 mg and increase your fluid intake. If you experience trouble breathing, this can be serious. If it is severe call 911 IMMEDIATELY. If it is mild, please call our office. If you take any of these medications: Glipizide/Metformin, Avandament, Glucavance, please do  not take 48 hours after completing test unless otherwise instructed.  Please allow 2-4 weeks for scheduling of routine cardiac CTs. Some insurance companies require a pre-authorization which may delay scheduling of this test.   For non-scheduling related questions, please contact the cardiac imaging nurse navigator should you have any  questions/concerns: Marchia Bond, Cardiac Imaging Nurse Navigator Gordy Clement, Cardiac Imaging Nurse Navigator [facility] Direct Office Dial: [phone]   For scheduling needs, including cancellations and rescheduling, please call [place], [phone].

## 2022-01-01 ENCOUNTER — Telehealth (HOSPITAL_COMMUNITY): Payer: Self-pay | Admitting: *Deleted

## 2022-01-01 NOTE — Telephone Encounter (Signed)
Attempted to call patient regarding upcoming cardiac CT appointment. °Left message on voicemail with name and callback number ° °Abdulwahab Demelo RN Navigator Cardiac Imaging °[facility] °[phone] Office °[phone] Cell ° °

## 2022-01-02 ENCOUNTER — Telehealth (HOSPITAL_COMMUNITY): Payer: Self-pay | Admitting: *Deleted

## 2022-01-02 NOTE — Telephone Encounter (Signed)
Reaching out to patient to offer assistance regarding upcoming cardiac imaging study; pt verbalizes understanding of appt date/time, parking situation and where to check in, pre-test NPO status and medications ordered, and verified current allergies; name and call back number provided for further questions should they arise  Gordy Clement RN Navigator Cardiac Imaging [facility] [phone] office [phone] cell  Patient to take 50mg  metoprolol tartrate two hours prior to cardiac CT scan. She is aware to arrive at 8:30am for her 9am.

## 2022-01-03 ENCOUNTER — Ambulatory Visit (HOSPITAL_COMMUNITY)
Admission: RE | Admit: 2022-01-03 | Discharge: 2022-01-03 | Disposition: A | Discharge status: Home / Self Care | Payer: Medicare PPO | Source: Ambulatory Visit | Attending: Cardiovascular Disease | Admitting: Cardiovascular Disease

## 2022-01-03 ENCOUNTER — Other Ambulatory Visit: Payer: Self-pay

## 2022-01-03 DIAGNOSIS — R072 Precordial pain: Secondary | ICD-10-CM | POA: Diagnosis not present

## 2022-01-03 DIAGNOSIS — R931 Abnormal findings on diagnostic imaging of heart and coronary circulation: Secondary | ICD-10-CM | POA: Diagnosis not present

## 2022-01-03 DIAGNOSIS — I2510 Atherosclerotic heart disease of native coronary artery without angina pectoris: Secondary | ICD-10-CM | POA: Diagnosis not present

## 2022-01-03 MED ORDER — NITROGLYCERIN 0.4 MG SL SUBL
SUBLINGUAL_TABLET | SUBLINGUAL | Status: AC
  Administered 2022-01-03: 0.8 mg via SUBLINGUAL
  Filled 2022-01-03: qty 2

## 2022-01-03 MED ORDER — NITROGLYCERIN 0.4 MG SL SUBL: 0.8000 mg | SUBLINGUAL_TABLET | Freq: Once | SUBLINGUAL | Status: AC

## 2022-01-03 MED ORDER — IOHEXOL 350 MG/ML SOLN
95.0000 mL | Freq: Once | INTRAVENOUS | Status: AC | PRN
  Administered 2022-01-03: 95 mL via INTRAVENOUS

## 2022-01-04 ENCOUNTER — Ambulatory Visit (HOSPITAL_COMMUNITY)
Admission: RE | Admit: 2022-01-04 | Discharge: 2022-01-04 | Disposition: A | Discharge status: Home / Self Care | Payer: Medicare PPO | Source: Ambulatory Visit | Attending: Cardiology | Admitting: Cardiology

## 2022-01-04 ENCOUNTER — Other Ambulatory Visit: Payer: Self-pay

## 2022-01-04 ENCOUNTER — Other Ambulatory Visit (HOSPITAL_COMMUNITY): Payer: Self-pay | Admitting: Emergency Medicine

## 2022-01-04 DIAGNOSIS — I2510 Atherosclerotic heart disease of native coronary artery without angina pectoris: Secondary | ICD-10-CM

## 2022-01-04 DIAGNOSIS — R931 Abnormal findings on diagnostic imaging of heart and coronary circulation: Secondary | ICD-10-CM | POA: Insufficient documentation

## 2022-01-04 DIAGNOSIS — R079 Chest pain, unspecified: Secondary | ICD-10-CM | POA: Diagnosis not present

## 2022-01-09 DIAGNOSIS — I2510 Atherosclerotic heart disease of native coronary artery without angina pectoris: Secondary | ICD-10-CM | POA: Diagnosis not present

## 2022-01-09 DIAGNOSIS — R931 Abnormal findings on diagnostic imaging of heart and coronary circulation: Secondary | ICD-10-CM

## 2022-01-18 ENCOUNTER — Ambulatory Visit: Payer: Medicare PPO | Admitting: Interventional Cardiology

## 2022-01-23 DIAGNOSIS — R21 Rash and other nonspecific skin eruption: Secondary | ICD-10-CM | POA: Diagnosis not present

## 2022-01-23 DIAGNOSIS — R34 Anuria and oliguria: Secondary | ICD-10-CM | POA: Diagnosis not present

## 2022-01-23 DIAGNOSIS — R350 Frequency of micturition: Secondary | ICD-10-CM | POA: Diagnosis not present

## 2022-01-31 ENCOUNTER — Ambulatory Visit: Payer: Medicare PPO | Admitting: Pharmacist Clinician (PhC)/ Clinical Pharmacy Specialist

## 2022-01-31 ENCOUNTER — Other Ambulatory Visit: Payer: Self-pay

## 2022-01-31 DIAGNOSIS — E7800 Pure hypercholesterolemia, unspecified: Secondary | ICD-10-CM

## 2022-01-31 NOTE — Patient Instructions (Signed)
Your Results:             Your most recent labs Goal  Total Cholesterol 217 < 200  Triglycerides 110 < 150  HDL (happy/good cholesterol) 60 > 40  LDL (lousy/bad cholesterol 137 < 70      Medication changes:  We will start the process to get Repatha covered by your insurance.    Do one injection every 14 days  Repeat labs after 4-6 doses (the day you come to see Dr. Sallyanne Kuster is perfect)  Patient Assistance:  The [employer] Well foundation offers assistance to help pay for medication copays.  They will cover copays for all cholesterol lowering meds, including statins, fibrates, omega-3 oils, ezetimibe, Repatha, Praluent, Nexletol, Nexlizet.  The cards are usually good for $2,500 or 12 months, whichever comes first. Go to healthwellfoundation.org Click on Apply Now Answer questions as to whom is applying (patient or representative) Your disease fund will be hypercholesterolemia - Medicare access They will ask questions about finances and which medications you are taking for cholesterol When you submit, the approval is usually within minutes.  You will need to print the card information from the site You will need to show this information to your pharmacy, they will bill your Medicare Part D plan first -then bill Health Well --for the copay.   You can also call them at [phone], although the hold times can be quite long.   Thank you for choosing [facility]

## 2022-01-31 NOTE — Progress Notes (Signed)
02/04/2022 Olivia Espinoza [birth year] [id number]   HPI:  Olivia Espinoza is a 81 y.o. female patient of Dr Sallyanne Kuster, who presents today for a lipid clinic evaluation.  See pertinent past medical history below.  Past Medical History: CAD 1/23: Coronary calcium score 541 (83rd percentile)  hypertension Elevated at last MD visit, on amlodipine 5 mg  hypothyroidism 12/22 TSH WNL - followed by PCP    Current Medications: ezetimibe 10  Cholesterol Goals: LDL < 70   Intolerant/previously tried: simvastatin, rosuvastatin - myalgias  Family history: fatther had first MI at 2, second at 48, stroke at 63, died at 7 (debilitated) 1 brother with CABG x 3 at 54 doing well at 81, 1 brother now 28 (prior triatherlete, still athletic, CABG x 5 last year; mother no issues until 45's, although family hx of HF, aneurysm; 4 kids - 2 with hypertension and hyperlipidemia  Diet: mostly home cooked meals, ate different during holidays; at one time did vegan diet for about 18 months, but admits she got "sick of it" and now just tries to eat healthy overall.    Exercise:  not now, prefers nice weather, walks in summer  Labs: 12/22: TC 217, TG 110, HDL 60, LDL 137    Current Outpatient Medications  Medication Sig Dispense Refill   amLODipine (NORVASC) 5 MG tablet Take 5 mg by mouth daily in the afternoon.     aspirin EC 81 MG tablet Take 81 mg by mouth daily.     ezetimibe (ZETIA) 10 MG tablet Take 1 tablet by mouth daily.     levothyroxine (SYNTHROID) 75 MCG tablet Take by mouth daily before breakfast.      melatonin 5 MG TABS Take 5 mg by mouth at bedtime as needed.     Multiple Vitamin (MULTIVITAMIN WITH MINERALS) TABS tablet Take 1 tablet by mouth daily.     nitrofurantoin, macrocrystal-monohydrate, (MACROBID) 100 MG capsule Take 1 capsule by mouth 2 (two) times daily.     metoprolol tartrate (LOPRESSOR) 50 MG tablet Take one tablet two hours prior to the test (Patient not taking: Reported on 01/31/2022)  1 tablet 0   No current facility-administered medications for this visit.    Allergies  Allergen Reactions   Chlorhexidine Gluconate Itching   Levofloxacin Other (See Comments)    Made her feel like someone hit her in the stomach with a bat   Motrin [Ibuprofen] Hives    Can take aspirin   Rosuvastatin Other (See Comments)    Other reaction(s): Myalgias (intolerance)   Ciprofloxacin     Other reaction(s): Patient is not sure she is even allergic to this   Ezetimibe     Other reaction(s): questionable rash but was lisinopril   Lisinopril     Other reaction(s): rash   Ceclor [Cefaclor] Rash    Prefers amoxicillin per pt    Past Medical History:  Diagnosis Date   Cancer ([place])    Hyperlipemia    Hypertension    Hypothyroidism    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 rate 14, height 5' 1.5" (1.562 m), weight 117 lb 6.4 oz (53.3 kg), SpO2 (!) 89 %.   Hypercholesterolemia Patient with CAD and intolerant to multiple statin drugs.  Currently on ezetimibe 10 mg daily with LDL at 137.  Reviewed options for lowering LDL cholesterol, including PCSK-9 inhibitors, bempedoic  acid and inclisiran.  Discussed mechanisms of action, dosing, side effects and potential decreases in LDL cholesterol.  Also reviewed cost information and potential options for patient assistance.  Answered all patient questions.  Based on this information, patient would prefer to start [place].  Will do prior authorization and once approved she will need to repeat lipid labs after 4-6 doses.   Tommy Medal PharmD CPP [facility] [address] [place],  [address] [phone]

## 2022-02-04 ENCOUNTER — Telehealth: Payer: Self-pay

## 2022-02-04 ENCOUNTER — Encounter: Payer: Self-pay | Admitting: Pharmacist Clinician (PhC)/ Clinical Pharmacy Specialist

## 2022-02-04 DIAGNOSIS — E7800 Pure hypercholesterolemia, unspecified: Secondary | ICD-10-CM

## 2022-02-04 NOTE — Telephone Encounter (Signed)
-----   Message from Rockne Menghini, [place] sent at 02/04/2022  1:59 PM EST ----- Patient needs Repatha 140 mg q14d and repeat lipid/liver labs after 4-6 doses.   Thank you!

## 2022-02-04 NOTE — Assessment & Plan Note (Signed)
Patient with CAD and intolerant to multiple statin drugs.  Currently on ezetimibe 10 mg daily with LDL at 137.  Reviewed options for lowering LDL cholesterol, including PCSK-9 inhibitors, bempedoic acid and inclisiran.  Discussed mechanisms of action, dosing, side effects and potential decreases in LDL cholesterol.  Also reviewed cost information and potential options for patient assistance.  Answered all patient questions.  Based on this information, patient would prefer to start [place].  Will do prior authorization and once approved she will need to repeat lipid labs after 4-6 doses.

## 2022-02-04 NOTE — Telephone Encounter (Signed)
PA SUBMITTED FOR REPATHA 140MG  Q2W.  Olivia Espinoza (Key: [id number])  LIPID/HEPATIC PANEL ORDERED/RELEASED

## 2022-02-05 MED ORDER — REPATHA SURECLICK 140 MG/ML ~~LOC~~ SOAJ: 140.0000 mg | SUBCUTANEOUS | 11 refills | Status: DC

## 2022-02-05 NOTE — Addendum Note (Signed)
Addended by: Allean Found on: 02/05/2022 10:30 AM   Modules accepted: Orders

## 2022-02-05 NOTE — Telephone Encounter (Signed)
Called and spoke to pt and stated that they were approved for the repatha, rx sent, healthwell approval given during appt, instructed the pt to complete fasting labs post 4th dose and they voiced understanding.

## 2022-02-06 DIAGNOSIS — L739 Follicular disorder, unspecified: Secondary | ICD-10-CM | POA: Diagnosis not present

## 2022-02-15 ENCOUNTER — Telehealth: Payer: Self-pay | Admitting: Pharmacist

## 2022-02-15 ENCOUNTER — Telehealth: Payer: Self-pay | Admitting: Cardiovascular Disease

## 2022-02-15 NOTE — Telephone Encounter (Signed)
Patient came in, was having problems injecting her Repatha.  Educated patient on how to inject again.  Had issues with injecting on her belly.  Recommended next time she try by standing or try the sides of her legs.  Patient voiced understanding.

## 2022-02-15 NOTE — Telephone Encounter (Signed)
Spoke with patient who requests someone observe her give herself the repatha injection. Pharmacist Gerald Stabs will see her before 4:15 today. Patient was thankful.

## 2022-02-15 NOTE — Telephone Encounter (Signed)
Pt c/o medication issue:  1. Name of Medication: Evolocumab (REPATHA SURECLICK) 643 MG/ML SOAJ  2. How are you currently taking this medication (dosage and times per day)? Patient has not started taking yet   3. Are you having a reaction (difficulty breathing--STAT)? no  4. What is your medication issue? Patient wanted to know if an RN or Pharmacist would be able to supervise her while she does her first shot. She is a little nervous about doing it on her own.

## 2022-03-06 ENCOUNTER — Telehealth: Payer: Self-pay | Admitting: Cardiovascular Disease

## 2022-03-06 NOTE — Telephone Encounter (Signed)
Pt would like to know if she should continue the zetia along with the repatha or just use the repatha.. please advise  ?

## 2022-03-06 NOTE — Telephone Encounter (Signed)
Stop the zetia please ?

## 2022-03-08 NOTE — Telephone Encounter (Signed)
Returned the call to the patient. She has been made aware to discontinue the Zetia.  ?

## 2022-03-13 DIAGNOSIS — H00014 Hordeolum externum left upper eyelid: Secondary | ICD-10-CM | POA: Diagnosis not present

## 2022-04-05 ENCOUNTER — Encounter: Payer: Self-pay | Admitting: Cardiovascular Disease

## 2022-04-05 ENCOUNTER — Ambulatory Visit: Payer: Medicare PPO | Admitting: Cardiovascular Disease

## 2022-04-05 VITALS — BP 140/70 | HR 86 | Ht 61.0 in | Wt 120.4 lb

## 2022-04-05 DIAGNOSIS — T466X5D Adverse effect of antihyperlipidemic and antiarteriosclerotic drugs, subsequent encounter: Secondary | ICD-10-CM

## 2022-04-05 DIAGNOSIS — E7800 Pure hypercholesterolemia, unspecified: Secondary | ICD-10-CM | POA: Diagnosis not present

## 2022-04-05 DIAGNOSIS — I2510 Atherosclerotic heart disease of native coronary artery without angina pectoris: Secondary | ICD-10-CM | POA: Diagnosis not present

## 2022-04-05 DIAGNOSIS — G720 Drug-induced myopathy: Secondary | ICD-10-CM

## 2022-04-05 DIAGNOSIS — I10 Essential (primary) hypertension: Secondary | ICD-10-CM | POA: Diagnosis not present

## 2022-04-05 NOTE — Progress Notes (Signed)
?Cardiology Office Note:   ? ?Date:  04/05/2022  ? ?ID:  Olivia Espinoza, DOB 02/26/1941, MRN [id number] ? ?PCP:  Vernie Shanks, MD ?  ?[facility] Providers ?Cardiologist:  Sanda Klein, MD    ? ?Referring MD: Vernie Shanks, MD  ? ?Chief Complaint  ?Patient presents with  ? Coronary Artery Disease  ?Olivia Espinoza is a 81 y.o. female who is being seen today for the evaluation of coronary artery calcification at the request of Vernie Shanks, MD. ? ? ?History of Present Illness:   ? ?Olivia Espinoza is a 81 y.o. female with a hx of hypercholesterolemia, hypertension, treated hypothyroidism and a strong family history of coronary disease. ? ?She returns in follow-up after undergoing a coronary CT angiogram.  The study did not show any meaningful obstructive lesions, although she did have mild stenoses in multiple territories.  The calcium score however was quite high at 541, placing her in the 85th percentile for age and gender. ? ?The patient specifically denies any chest pain at rest exertion, dyspnea at rest or with exertion, orthopnea, paroxysmal nocturnal dyspnea, syncope, palpitations, focal neurological deficits, intermittent claudication, lower extremity edema, unexplained weight gain, cough, hemoptysis or wheezing.  ? ?Her father had a myocardial infarction at age 46, her brother had bypass surgery at age 16.  Another brother who was always very healthy, a triathlete and very lean, ended up having bypass surgery at age 57. ? ?The patient has tried taking statins for hypercholesterolemia but developed leg cramps with simvastatin and generalized myalgia with rosuvastatin.  She started treatment with Repatha about a month ago, has received 3 or 4 doses so far.  Feeling a little tired at times, wonders whether the Repatha could interfere with her levothyroxine.  I reassured her this is not likely. ? ?She had a normal nuclear stress test in 2018.  This was performed because a chest CT described aortic  atherosclerosis and coronary artery calcifications.  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 Roxan Hockey in 2017. ? ?Her most recent lipid profile performed just a week ago showed an LDL cholesterol of 137, while taking monotherapy with ezetimibe 10 mg once daily.  Last performed in 2012, when I assume she was not on any therapy, showed a total cholesterol 232 and an LDL cholesterol of 149.  Her HDL is always good in the high 50s-60 range.  She has normal triglycerides. ? ?Past Medical History:  ?Diagnosis Date  ? Cancer [facility])   ? Hyperlipemia   ? Hypertension   ? Hypothyroidism   ? Nevus   ? in eye  ? Paraspinal mass   ? PONV (postoperative nausea and vomiting)   ? Seasonal allergies   ? ? ?Past Surgical History:  ?Procedure Laterality Date  ? BTL miscarrage    ? 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 Procedure: RESECTION OF MASS RIGHT THORACIC INLET VIA SUPRACLAVICULAR APPROACH;  Surgeon: Melrose Nakayama, MD;  Location: [facility];  Service: Thoracic;  Laterality: Right;  ? TUBAL LIGATION    ? ? ?Current Medications: ?Current Meds  ?Medication Sig  ? amLODipine (NORVASC) 5 MG tablet Take 5 mg by mouth daily in the afternoon.  ? aspirin EC 81 MG tablet Take 81 mg by mouth daily.  ?  Evolocumab (REPATHA SURECLICK) 332 MG/ML SOAJ Inject 140 mg into the skin every 14 (fourteen) days.  ? levothyroxine (SYNTHROID) 75 MCG tablet Take by mouth daily before breakfast.   ? melatonin 5 MG TABS Take 5 mg by mouth at bedtime as needed.  ? Multiple Vitamin (MULTIVITAMIN WITH MINERALS) TABS tablet Take 1 tablet by mouth daily.  ?  ? ?Allergies:   Chlorhexidine gluconate, Levofloxacin, Motrin [ibuprofen], Rosuvastatin, Ciprofloxacin, Ezetimibe, Lisinopril, and Ceclor [cefaclor]  ? ?Social History   ? ?Socioeconomic History  ? Marital status: Married  ?  Spouse name: Not on file  ? Number of children: Not on file  ? Years of education: Not on file  ? Highest education level: Not on file  ?Occupational History  ? Occupation: retired  ?  Comment: teacher asst  ?Tobacco Use  ? Smoking status: Never  ? Smokeless tobacco: Never  ?Substance and Sexual Activity  ? Alcohol use: Yes  ?  Alcohol/week: 0.0 standard drinks  ?  Comment: occassionally  ? Drug use: No  ? Sexual activity: Not on file  ?Other Topics Concern  ? Not on file  ?Social History Narrative  ? Not on file  ? ?Social Determinants of Health  ? ?Financial Resource Strain: Not on file  ?Food Insecurity: Not on file  ?Transportation Needs: Not on file  ?Physical Activity: Not on file  ?Stress: Not on file  ?Social Connections: Not on file  ?  ? ?Family History: ?The patient's family history includes CVA in her father; Heart attack in her father.  Her father's heart attack occurred at age 37.  2 of her brothers had bypass surgery, 1 at age 34, the other at age 56 ? ?ROS:   ?Please see the history of present illness.    ? All other systems reviewed and are negative. ? ?EKGs/Labs/Other Studies Reviewed:   ? ?The following studies were reviewed today: ?Lexiscan Myoview 01/10/2017 ? ?Nuclear stress EF: 78%. ?The study is normal. ?This is a low risk study. ?The left ventricular ejection fraction is hyperdynamic (>65%). ?There was no ST segment deviation noted during stress. ?  ?Extracardiac activity obscures the mid and basal inferior wall ?No other infarct/ischemia ?EF normal 78%  ?Low risk study  ? ?CT chest June 09, 2018 ?1. Postoperative changes in the right supraclavicular region appear ?similar to prior examinations related to resected schwannoma. No ?definite findings to suggest local recurrence. ?2. No acute findings in the thorax. ?3. Aortic atherosclerosis, in addition to three-vessel coronary ?artery disease. Assessment for potential risk factor  modification, ?dietary therapy or pharmacologic therapy may be warranted, if ?clinically indicated. ? ? ?Coronary CTA 01/03/2022 ?1. Coronary artery calcium score 541 Agatston units. This places the patient in the 83rd percentile for age and gender, suggesting high risk for future cardiac events. ?2.  Nonobstructive coronary disease. ? ?EKG:  EKG is  ordered today.  It shows sinus rhythm with a single PVC and borderline voltage criteria for LVH with nonspecific repolarization abnormalities (< 55m ST segment depression in the inferior leads).  Borderline left axis deviation.  QTc 423 ms.   ?Recent Labs: ?No results found for requested labs within last 8760 hours.  ?01/23/2022 ?Creatinine 1.02, potassium 4.7, ALT 19 ?Recent Lipid Panel ?   ?Component Value Date/Time  ? CHOL 232 (H) 03/25/2011 1250  ? TRIG 133 03/25/2011 1250  ? HDL 56 03/25/2011 1250  ? CHOLHDL 4.1 03/25/2011 1250  ? VLDL 27 03/25/2011 1250  ? LDLCALC 149 (H) 03/25/2011 1250  ? ?  12/05/2021 ?Cholesterol 217, HDL 60, LDL 137, triglycerides 110 ?Creatinine 0.94, potassium 4.8, ALT 14, TSH 1.77 ? ?Risk Assessment/Calculations:   ?     ?Physical Exam:   ? ?VS:  BP 140/70   Pulse 86   Ht '5\' 1"'$  (1.549 m)   Wt 120 lb 6.4 oz (54.6 kg)   SpO2 99%   BMI 22.75 kg/m?    ? ?Wt Readings from Last 3 Encounters:  ?04/05/22 120 lb 6.4 oz (54.6 kg)  ?01/31/22 117 lb 6.4 oz (53.3 kg)  ?12/14/21 118 lb 6.4 oz (53.7 kg)  ?  ? ? ?General: Alert, oriented x3, no distress, appears lean and fit and younger than stated age ?Head: no evidence of trauma, PERRL, EOMI, no exophtalmos or lid lag, no myxedema, no xanthelasma; normal ears, nose and oropharynx ?Neck: normal jugular venous pulsations and no hepatojugular reflux; brisk carotid pulses without delay and no carotid bruits ?Chest: clear to auscultation, no signs of consolidation by percussion or palpation, normal fremitus, symmetrical and full respiratory excursions ?Cardiovascular: normal position and quality of the  apical impulse, regular rhythm, normal first and second heart sounds, no murmurs, rubs or gallops ?Abdomen: no tenderness or distention, no masses by palpation, no abnormal pulsatility or arterial bruits, normal bowel sounds,

## 2022-04-05 NOTE — Patient Instructions (Signed)
Medication Instructions:  ?No changes ?*If you need a refill on your cardiac medications before your next appointment, please call your pharmacy* ? ? ?Lab Work: ?Your provider would like for you to return in one month to have the following labs drawn: fasting lipid and liver. You do not need an appointment for the lab. Once in our office lobby there is a podium where you can sign in and ring the doorbell to alert [place] that you are here. The lab is open from 8:00 am to 4 pm; closed for lunch from 12:45pm-1:45pm. ? ?You may also go to any of these [facility] locations: ?  ?[place] ?- [place] ([facility]) ?- [address] ?- [place]  ?[place] ?- [address]  ?  ?[place]  ?- [place] [address]  ?  ?[place] ?- [place]  ?[place]  ?- [place] ?- [place] [address] ([facility] ? ?If you have labs (blood work) drawn today and your tests are completely normal, you will receive your results only by: ?MyChart Message (if you have MyChart) OR ?A paper copy in the mail ?If you have any lab test that is abnormal or we need to change your treatment, we will call you to review the results. ? ? ?Testing/Procedures: ?None ordered ? ? ?Follow-Up: ?At [facility], you and your health needs are our priority.  As part of our continuing mission to provide you with exceptional heart care, we have created designated Provider Care Teams.  These Care Teams include your primary Cardiologist (physician) and Advanced Practice Providers (APPs -  Physician Assistants and Nurse Practitioners) who all work together to provide you with the care you need, when you need it. ? ?We recommend signing up for the patient portal called "MyChart".  Sign up information is provided on this After Visit Summary.  MyChart is used to connect with patients for Virtual Visits (Telemedicine).  Patients  are able to view lab/test results, encounter notes, upcoming appointments, etc.  Non-urgent messages can be sent to your provider as well.   ?To learn more about what you can do with MyChart, go to [email or web address].   ? ?Your next appointment:   ?12 month(s) ? ?The format for your next appointment:   ?In Person ? ?Provider:   ?Sanda Klein, MD { ? ? ? ?

## 2022-04-11 DIAGNOSIS — E039 Hypothyroidism, unspecified: Secondary | ICD-10-CM | POA: Diagnosis not present

## 2022-04-11 DIAGNOSIS — E785 Hyperlipidemia, unspecified: Secondary | ICD-10-CM | POA: Diagnosis not present

## 2022-04-19 DIAGNOSIS — R21 Rash and other nonspecific skin eruption: Secondary | ICD-10-CM | POA: Diagnosis not present

## 2022-04-19 DIAGNOSIS — L739 Follicular disorder, unspecified: Secondary | ICD-10-CM | POA: Diagnosis not present

## 2022-04-19 DIAGNOSIS — I10 Essential (primary) hypertension: Secondary | ICD-10-CM | POA: Diagnosis not present

## 2022-04-19 DIAGNOSIS — E039 Hypothyroidism, unspecified: Secondary | ICD-10-CM | POA: Diagnosis not present

## 2022-04-19 DIAGNOSIS — E785 Hyperlipidemia, unspecified: Secondary | ICD-10-CM | POA: Diagnosis not present

## 2022-05-06 DIAGNOSIS — E7800 Pure hypercholesterolemia, unspecified: Secondary | ICD-10-CM | POA: Diagnosis not present

## 2022-05-07 LAB — LIPID PANEL
Chol/HDL Ratio: 2.2 ratio (ref 0.0–4.4)
Cholesterol, Total: 155 mg/dL (ref 100–199)
HDL: 71 mg/dL (ref >39)
LDL Chol Calc (NIH): 65 mg/dL (ref 0–99)
Triglycerides: 108 mg/dL (ref 0–149)
VLDL Cholesterol Cal: 19 mg/dL (ref 5–40)

## 2022-05-07 LAB — HEPATIC FUNCTION PANEL
ALT: 19 IU/L (ref 0–32)
AST: 30 IU/L (ref 0–40)
Albumin: 4.1 g/dL (ref 3.7–4.7)
Alkaline Phosphatase: 117 IU/L (ref 44–121)
Bilirubin Total: 0.7 mg/dL (ref 0.0–1.2)
Bilirubin, Direct: 0.19 mg/dL (ref 0.00–0.40)
Total Protein: 6.9 g/dL (ref 6.0–8.5)

## 2022-05-16 DIAGNOSIS — I10 Essential (primary) hypertension: Secondary | ICD-10-CM | POA: Diagnosis not present

## 2022-05-16 DIAGNOSIS — E039 Hypothyroidism, unspecified: Secondary | ICD-10-CM | POA: Diagnosis not present

## 2022-05-17 DIAGNOSIS — L570 Actinic keratosis: Secondary | ICD-10-CM | POA: Diagnosis not present

## 2022-05-17 DIAGNOSIS — L728 Other follicular cysts of the skin and subcutaneous tissue: Secondary | ICD-10-CM | POA: Diagnosis not present

## 2022-05-17 DIAGNOSIS — C44519 Basal cell carcinoma of skin of other part of trunk: Secondary | ICD-10-CM | POA: Diagnosis not present

## 2022-05-17 DIAGNOSIS — X32XXXD Exposure to sunlight, subsequent encounter: Secondary | ICD-10-CM | POA: Diagnosis not present

## 2022-05-23 DIAGNOSIS — I10 Essential (primary) hypertension: Secondary | ICD-10-CM | POA: Diagnosis not present

## 2022-05-30 DIAGNOSIS — I10 Essential (primary) hypertension: Secondary | ICD-10-CM | POA: Diagnosis not present

## 2022-05-30 DIAGNOSIS — R0789 Other chest pain: Secondary | ICD-10-CM | POA: Diagnosis not present

## 2022-06-04 DIAGNOSIS — H43812 Vitreous degeneration, left eye: Secondary | ICD-10-CM | POA: Diagnosis not present

## 2022-06-04 DIAGNOSIS — Z85840 Personal history of malignant neoplasm of eye: Secondary | ICD-10-CM | POA: Diagnosis not present

## 2022-06-04 DIAGNOSIS — C6932 Malignant neoplasm of left choroid: Secondary | ICD-10-CM | POA: Diagnosis not present

## 2022-06-04 DIAGNOSIS — T66XXXS Radiation sickness, unspecified, sequela: Secondary | ICD-10-CM | POA: Diagnosis not present

## 2022-06-04 DIAGNOSIS — H25813 Combined forms of age-related cataract, bilateral: Secondary | ICD-10-CM | POA: Diagnosis not present

## 2022-06-04 DIAGNOSIS — H3589 Other specified retinal disorders: Secondary | ICD-10-CM | POA: Diagnosis not present

## 2022-06-04 DIAGNOSIS — T66XXXD Radiation sickness, unspecified, subsequent encounter: Secondary | ICD-10-CM | POA: Diagnosis not present

## 2022-08-06 ENCOUNTER — Telehealth: Payer: Self-pay | Admitting: Pharmacist Clinician (PhC)/ Clinical Pharmacy Specialist

## 2022-08-06 NOTE — Telephone Encounter (Signed)
Repatha PA approved to 08/03/2023

## 2022-08-27 ENCOUNTER — Ambulatory Visit
Admission: RE | Admit: 2022-08-27 | Discharge: 2022-08-27 | Disposition: A | Discharge status: Home / Self Care | Payer: Medicare PPO | Source: Ambulatory Visit | Attending: Family Medicine | Admitting: Family Medicine

## 2022-08-27 ENCOUNTER — Other Ambulatory Visit: Payer: Self-pay | Admitting: Family Medicine

## 2022-08-27 DIAGNOSIS — C6992 Malignant neoplasm of unspecified site of left eye: Secondary | ICD-10-CM

## 2022-08-30 DIAGNOSIS — C6992 Malignant neoplasm of unspecified site of left eye: Secondary | ICD-10-CM | POA: Diagnosis not present

## 2022-09-06 DIAGNOSIS — H02821 Cysts of right upper eyelid: Secondary | ICD-10-CM | POA: Diagnosis not present

## 2022-09-06 DIAGNOSIS — H57813 Brow ptosis, bilateral: Secondary | ICD-10-CM | POA: Diagnosis not present

## 2022-09-06 DIAGNOSIS — H02834 Dermatochalasis of left upper eyelid: Secondary | ICD-10-CM | POA: Diagnosis not present

## 2022-09-06 DIAGNOSIS — H02831 Dermatochalasis of right upper eyelid: Secondary | ICD-10-CM | POA: Diagnosis not present

## 2022-09-06 DIAGNOSIS — Z85840 Personal history of malignant neoplasm of eye: Secondary | ICD-10-CM | POA: Diagnosis not present

## 2022-09-23 DIAGNOSIS — Z1231 Encounter for screening mammogram for malignant neoplasm of breast: Secondary | ICD-10-CM | POA: Diagnosis not present

## 2022-11-25 DIAGNOSIS — L570 Actinic keratosis: Secondary | ICD-10-CM | POA: Diagnosis not present

## 2022-11-25 DIAGNOSIS — Z08 Encounter for follow-up examination after completed treatment for malignant neoplasm: Secondary | ICD-10-CM | POA: Diagnosis not present

## 2022-11-25 DIAGNOSIS — Z85820 Personal history of malignant melanoma of skin: Secondary | ICD-10-CM | POA: Diagnosis not present

## 2022-11-25 DIAGNOSIS — Z1283 Encounter for screening for malignant neoplasm of skin: Secondary | ICD-10-CM | POA: Diagnosis not present

## 2022-11-25 DIAGNOSIS — X32XXXD Exposure to sunlight, subsequent encounter: Secondary | ICD-10-CM | POA: Diagnosis not present

## 2022-12-05 DIAGNOSIS — M25572 Pain in left ankle and joints of left foot: Secondary | ICD-10-CM | POA: Diagnosis not present

## 2022-12-05 DIAGNOSIS — M25571 Pain in right ankle and joints of right foot: Secondary | ICD-10-CM | POA: Diagnosis not present

## 2022-12-26 DIAGNOSIS — M25571 Pain in right ankle and joints of right foot: Secondary | ICD-10-CM | POA: Diagnosis not present

## 2022-12-26 DIAGNOSIS — M25572 Pain in left ankle and joints of left foot: Secondary | ICD-10-CM | POA: Diagnosis not present

## 2023-01-10 DIAGNOSIS — Z6823 Body mass index (BMI) 23.0-23.9, adult: Secondary | ICD-10-CM | POA: Diagnosis not present

## 2023-01-10 DIAGNOSIS — J019 Acute sinusitis, unspecified: Secondary | ICD-10-CM | POA: Diagnosis not present

## 2023-01-20 DIAGNOSIS — L570 Actinic keratosis: Secondary | ICD-10-CM | POA: Diagnosis not present

## 2023-01-20 DIAGNOSIS — X32XXXD Exposure to sunlight, subsequent encounter: Secondary | ICD-10-CM | POA: Diagnosis not present

## 2023-01-29 DIAGNOSIS — H9193 Unspecified hearing loss, bilateral: Secondary | ICD-10-CM | POA: Diagnosis not present

## 2023-01-29 DIAGNOSIS — N1831 Chronic kidney disease, stage 3a: Secondary | ICD-10-CM | POA: Diagnosis not present

## 2023-01-29 DIAGNOSIS — E785 Hyperlipidemia, unspecified: Secondary | ICD-10-CM | POA: Diagnosis not present

## 2023-01-29 DIAGNOSIS — Z23 Encounter for immunization: Secondary | ICD-10-CM | POA: Diagnosis not present

## 2023-01-29 DIAGNOSIS — E039 Hypothyroidism, unspecified: Secondary | ICD-10-CM | POA: Diagnosis not present

## 2023-01-29 DIAGNOSIS — I10 Essential (primary) hypertension: Secondary | ICD-10-CM | POA: Diagnosis not present

## 2023-01-29 DIAGNOSIS — Z0000 Encounter for general adult medical examination without abnormal findings: Secondary | ICD-10-CM | POA: Diagnosis not present

## 2023-01-29 DIAGNOSIS — I700 Atherosclerosis of aorta: Secondary | ICD-10-CM | POA: Diagnosis not present

## 2023-01-29 DIAGNOSIS — Z85840 Personal history of malignant neoplasm of eye: Secondary | ICD-10-CM | POA: Diagnosis not present

## 2023-02-08 ENCOUNTER — Other Ambulatory Visit: Payer: Self-pay | Admitting: Cardiovascular Disease

## 2023-02-17 DIAGNOSIS — Z03818 Encounter for observation for suspected exposure to other biological agents ruled out: Secondary | ICD-10-CM | POA: Diagnosis not present

## 2023-02-17 DIAGNOSIS — J208 Acute bronchitis due to other specified organisms: Secondary | ICD-10-CM | POA: Diagnosis not present

## 2023-02-17 DIAGNOSIS — Z6822 Body mass index (BMI) 22.0-22.9, adult: Secondary | ICD-10-CM | POA: Diagnosis not present

## 2023-02-17 DIAGNOSIS — R051 Acute cough: Secondary | ICD-10-CM | POA: Diagnosis not present

## 2023-02-17 DIAGNOSIS — R52 Pain, unspecified: Secondary | ICD-10-CM | POA: Diagnosis not present

## 2023-03-19 DIAGNOSIS — E039 Hypothyroidism, unspecified: Secondary | ICD-10-CM | POA: Diagnosis not present

## 2023-03-19 DIAGNOSIS — H9193 Unspecified hearing loss, bilateral: Secondary | ICD-10-CM | POA: Diagnosis not present

## 2023-03-24 ENCOUNTER — Encounter: Payer: Self-pay | Admitting: Cardiovascular Disease

## 2023-03-24 ENCOUNTER — Ambulatory Visit: Payer: Medicare PPO | Attending: Cardiovascular Disease | Admitting: Cardiovascular Disease

## 2023-03-24 VITALS — BP 134/72 | HR 82 | Ht 61.5 in | Wt 124.0 lb

## 2023-03-24 DIAGNOSIS — I700 Atherosclerosis of aorta: Secondary | ICD-10-CM

## 2023-03-24 DIAGNOSIS — I10 Essential (primary) hypertension: Secondary | ICD-10-CM | POA: Diagnosis not present

## 2023-03-24 DIAGNOSIS — I2510 Atherosclerotic heart disease of native coronary artery without angina pectoris: Secondary | ICD-10-CM

## 2023-03-24 DIAGNOSIS — E7800 Pure hypercholesterolemia, unspecified: Secondary | ICD-10-CM | POA: Diagnosis not present

## 2023-03-24 DIAGNOSIS — G720 Drug-induced myopathy: Secondary | ICD-10-CM | POA: Diagnosis not present

## 2023-03-24 DIAGNOSIS — T466X5D Adverse effect of antihyperlipidemic and antiarteriosclerotic drugs, subsequent encounter: Secondary | ICD-10-CM

## 2023-03-24 NOTE — Patient Instructions (Signed)
Medication Instructions:  No changes *If you need a refill on your cardiac medications before your next appointment, please call your pharmacy*  Follow-Up: At [facility], you and your health needs are our priority.  As part of our continuing mission to provide you with exceptional heart care, we have created designated Provider Care Teams.  These Care Teams include your primary Cardiologist (physician) and Advanced Practice Providers (APPs -  Physician Assistants and Nurse Practitioners) who all work together to provide you with the care you need, when you need it.  We recommend signing up for the patient portal called "MyChart".  Sign up information is provided on this After Visit Summary.  MyChart is used to connect with patients for Virtual Visits (Telemedicine).  Patients are able to view lab/test results, encounter notes, upcoming appointments, etc.  Non-urgent messages can be sent to your provider as well.   To learn more about what you can do with MyChart, go to [email or web address].    Your next appointment:   1 year(s)  Provider:   Mihai Croitoru, MD      

## 2023-03-24 NOTE — Progress Notes (Unsigned)
Cardiology Office Note:    Date:  03/26/2023   ID:  Olivia Espinoza, DOB September 10, 1941, MRN [id number]  PCP:  Vernie Shanks, MD (Inactive) Lurline Del, MD   [place] Providers Cardiologist:  Sanda Klein, MD     Referring MD: No ref. provider found   Chief Complaint  Patient presents with   Coronary Artery Disease     History of Present Illness:    Olivia Espinoza is a 82 y.o. female with a hx of hypercholesterolemia, hypertension, treated hypothyroidism and a strong family history of coronary disease, who has "CAD-equivalent" elevated coronary calcium score at 541 (85th percentile).  Coronary CT angiography 01/04/2022 did not show any meaningful coronary stenoses.  She remains completely asymptomatic. The patient specifically denies any chest pain at rest exertion, dyspnea at rest or with exertion, orthopnea, paroxysmal nocturnal dyspnea, syncope, palpitations, focal neurological deficits, intermittent claudication, lower extremity edema, unexplained weight gain, cough, hemoptysis or wheezing.  She developed a rash with lisinopril.  She was then placed on valsartan which caused her to have fatigue and chest pain.  Her blood pressure is currently well-controlled on amlodipine and she has not had problems with edema.  She has a history of statin myopathy (rosuvastatin and simvastatin) and also did not tolerate ezetimibe.  Her lipid profile was excellent on Repatha.  Her most recent LDL was 63 and HDL 76 on 01/29/2023.  Her father had a myocardial infarction at age 73, her brother had bypass surgery at age 33.  Another brother who was always very healthy, a triathlete and very lean, ended up having bypass surgery at age 23.  She is upset that she has gained a few pounds, but remains her optimal weight with a BMI of 23.  She is very physically active.   She had a normal nuclear stress test in 2018.  This was performed because a chest CT described aortic atherosclerosis and  coronary artery calcifications.  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 Roxan Hockey in 2017.  Untreated lipid profile showed total cholesterol 232 and an LDL cholesterol of 149.  Her HDL is always good in the high 50s-60 range.  She has normal triglycerides.  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   aspirin EC 81 MG tablet Take 81 mg by mouth daily.   cetirizine (ZYRTEC) 10 MG tablet Take 10 mg by mouth daily as needed for allergies.   levothyroxine (SYNTHROID) 75 MCG tablet Take by mouth daily before breakfast.    melatonin 5 MG TABS Take 5 mg by mouth  at bedtime as needed.   Multiple Vitamin (MULTIVITAMIN WITH MINERALS) TABS tablet Take 1 tablet by mouth daily.   REPATHA SURECLICK [id number] MG/ML SOAJ ADMINISTER 1 ML(140MG ) UNDER THE SKIN EVERY 14 DAYS     Allergies:   Chlorhexidine gluconate, Levofloxacin, Motrin [ibuprofen], Rosuvastatin, Ciprofloxacin, Ezetimibe, Lisinopril, and Ceclor [cefaclor]   Social History   Socioeconomic History   Marital status: Married    Spouse name: Not on file   Number of children: Not on file   Years of education: Not on file    Highest education level: Not on file  Occupational History   Occupation: retired    Comment: Pharmacist, hospital asst  Tobacco Use   Smoking status: Never   Smokeless tobacco: Never  Substance and Sexual Activity   Alcohol use: Yes    Alcohol/week: 0.0 standard drinks of alcohol    Comment: occassionally   Drug use: No   Sexual activity: Not on file  Other Topics Concern   Not on file  Social History Narrative   Not on file   Social Determinants of Health   Financial Resource Strain: Not on file  Food Insecurity: Not on file  Transportation Needs: Not on file  Physical Activity: Not on file  Stress: Not on file  Social Connections: Not on file     Family History: The patient's family history includes CVA in her father; Heart attack in her father.  Her father's heart attack occurred at age 31.  2 of her brothers had bypass surgery, 1 at age 62, the other at age 57  ROS:   Please see the history of present illness.     All other systems reviewed and are negative.  EKGs/Labs/Other Studies Reviewed:    The following studies were reviewed today: Lexiscan Myoview 01/10/2017  Nuclear stress EF: 78%. The study is normal. This is a low risk study. The left ventricular ejection fraction is hyperdynamic (>65%). There was no ST segment deviation noted during stress.   Extracardiac activity obscures the mid and basal inferior wall No other infarct/ischemia EF normal 78%  Low risk study   CT chest June 09, 2018 1. Postoperative changes in the right supraclavicular region appear similar to prior examinations related to resected schwannoma. No definite findings to suggest local recurrence. 2. No acute findings in the thorax. 3. Aortic atherosclerosis, in addition to three-vessel coronary artery disease. Assessment for potential risk factor modification, dietary therapy or pharmacologic therapy may be warranted, if clinically indicated.   Coronary CTA 01/03/2022 1. Coronary  artery calcium score 541 Agatston units. This places the patient in the 83rd percentile for age and gender, suggesting high risk for future cardiac events. 2.  Nonobstructive coronary disease.  EKG:  EKG is  ordered today.  Shows normal sinus rhythm with somewhat low voltage throughout, QS pattern in leads V1-V2, QTc 453 ms.  Previous ECG showed sinus rhythm with a single PVC and borderline voltage criteria for LVH with nonspecific repolarization abnormalities (< 35mm ST segment depression in the inferior leads).  Borderline left axis deviation.  QTc 423 ms.   The difference and seem to be related to lead placement.  Recent Labs: 05/06/2022: ALT 19  01/23/2022 Creatinine 1.02, potassium 4.7, ALT 19 Recent Lipid Panel    Component Value Date/Time   CHOL 155 05/06/2022 1427   TRIG 108 05/06/2022 1427   HDL 71 05/06/2022 1427   CHOLHDL 2.2 05/06/2022 1427   CHOLHDL 4.1 03/25/2011 1250   VLDL 27 03/25/2011 1250  [place] 65 05/06/2022 1427   12/05/2021 Cholesterol 217, HDL 60, LDL 137, triglycerides 110 Creatinine 0.94, potassium 4.8, ALT 14, TSH 1.77  01/29/2023 Cholesterol 164, HDL 76, LDL 63, triglycerides 144 Creatinine 0.96, potassium 4.8, ALT 17, TSH 0.81  Risk Assessment/Calculations:        Physical Exam:    VS:  BP 134/72 (BP Location: Left Arm, Patient Position: Sitting, Cuff Size: Normal)   Pulse 82   Ht 5' 1.5" (1.562 m)   Wt 124 lb (56.2 kg)   SpO2 98%   BMI 23.05 kg/m     Wt Readings from Last 3 Encounters:  03/24/23 124 lb (56.2 kg)  04/05/22 120 lb 6.4 oz (54.6 kg)  01/31/22 117 lb 6.4 oz (53.3 kg)     General: Alert, oriented x3, no distress, appears lean and fit and younger than stated age Head: no evidence of trauma, PERRL, EOMI, no exophtalmos or lid lag, no myxedema, no xanthelasma; normal ears, nose and oropharynx Neck: normal jugular venous pulsations and no hepatojugular reflux; brisk carotid pulses without delay and no carotid bruits Chest: clear to  auscultation, no signs of consolidation by percussion or palpation, normal fremitus, symmetrical and full respiratory excursions Cardiovascular: normal position and quality of the apical impulse, regular rhythm, normal first and second heart sounds, no murmurs, rubs or gallops Abdomen: no tenderness or distention, no masses by palpation, no abnormal pulsatility or arterial bruits, normal bowel sounds, no hepatosplenomegaly Extremities: no clubbing, cyanosis or edema; 2+ radial, ulnar and brachial pulses bilaterally; 2+ right femoral, posterior tibial and dorsalis pedis pulses; 2+ left femoral, posterior tibial and dorsalis pedis pulses; no subclavian or femoral bruits Neurological: grossly nonfocal Psych: Normal mood and affect    ASSESSMENT:    1. Atherosclerosis of native coronary artery of native heart without angina pectoris   2. Aortic atherosclerosis ([place])   3. Essential hypertension   4. Hypercholesterolemia   5. Statin myopathy      PLAN:    In order of problems listed above:  Coronary and aortic atherosclerosis: Asymptomatic.  She has nonobstructive coronary artery disease and a markedly elevated calcium score.  I am glad that she is still physically active which should give [place] early warning if she has progression of disease.  The most important intervention is aggressive lipid-lowering to LDL less than 70. HTN: Well-controlled.  No changes made to medications. HLP: Myopathy with 2 different statins in the past.  Tolerating Repatha well with excellent lipid profile.      Medication Adjustments/Labs and Tests Ordered: Current medicines are reviewed at length with the patient today.  Concerns regarding medicines are outlined above.  Orders Placed This Encounter  Procedures   EKG 12-Lead   No orders of the defined types were placed in this encounter.   Patient Instructions  Medication Instructions:  No changes *If you need a refill on your cardiac medications before  your next appointment, please call your pharmacy*  Follow-Up: At [facility], you and your health needs are our priority.  As part of our continuing mission to provide you with exceptional heart care, we have created designated Provider Care Teams.  These Care Teams include your primary Cardiologist (physician) and Advanced Practice Providers (APPs -  Physician Assistants and Nurse Practitioners) who all work together to provide you with the care you need, when you need it.  We recommend signing up for the patient portal called "MyChart".  Sign up information is provided on this After Visit Summary.  MyChart is used to connect with patients for Virtual Visits (Telemedicine).  Patients are able to view lab/test results, encounter notes, upcoming appointments, etc.  Non-urgent messages can be sent to your provider as well.   To learn more about what you can do with MyChart, go to [email or web address].    Your next appointment:   1 year(s)  Provider:   Sanda Klein, MD       Signed, Sanda Klein, MD  03/26/2023 3:08 PM    [facility]

## 2023-03-26 ENCOUNTER — Encounter: Payer: Self-pay | Admitting: Cardiovascular Disease

## 2023-05-01 DIAGNOSIS — R413 Other amnesia: Secondary | ICD-10-CM | POA: Diagnosis not present

## 2023-06-11 DIAGNOSIS — H4312 Vitreous hemorrhage, left eye: Secondary | ICD-10-CM | POA: Diagnosis not present

## 2023-06-12 DIAGNOSIS — T66XXXS Radiation sickness, unspecified, sequela: Secondary | ICD-10-CM | POA: Diagnosis not present

## 2023-06-12 DIAGNOSIS — H43812 Vitreous degeneration, left eye: Secondary | ICD-10-CM | POA: Diagnosis not present

## 2023-06-12 DIAGNOSIS — H4312 Vitreous hemorrhage, left eye: Secondary | ICD-10-CM | POA: Diagnosis not present

## 2023-06-12 DIAGNOSIS — C6932 Malignant neoplasm of left choroid: Secondary | ICD-10-CM | POA: Diagnosis not present

## 2023-06-12 DIAGNOSIS — H25813 Combined forms of age-related cataract, bilateral: Secondary | ICD-10-CM | POA: Diagnosis not present

## 2023-06-12 DIAGNOSIS — H3589 Other specified retinal disorders: Secondary | ICD-10-CM | POA: Diagnosis not present

## 2023-06-24 DIAGNOSIS — H3589 Other specified retinal disorders: Secondary | ICD-10-CM | POA: Diagnosis not present

## 2023-06-24 DIAGNOSIS — H25813 Combined forms of age-related cataract, bilateral: Secondary | ICD-10-CM | POA: Diagnosis not present

## 2023-06-24 DIAGNOSIS — H43812 Vitreous degeneration, left eye: Secondary | ICD-10-CM | POA: Diagnosis not present

## 2023-06-24 DIAGNOSIS — H4312 Vitreous hemorrhage, left eye: Secondary | ICD-10-CM | POA: Diagnosis not present

## 2023-06-24 DIAGNOSIS — T66XXXS Radiation sickness, unspecified, sequela: Secondary | ICD-10-CM | POA: Diagnosis not present

## 2023-06-24 DIAGNOSIS — C6932 Malignant neoplasm of left choroid: Secondary | ICD-10-CM | POA: Diagnosis not present

## 2023-07-17 DIAGNOSIS — L821 Other seborrheic keratosis: Secondary | ICD-10-CM | POA: Diagnosis not present

## 2023-07-19 DIAGNOSIS — Z4789 Encounter for other orthopedic aftercare: Secondary | ICD-10-CM | POA: Diagnosis not present

## 2023-07-19 DIAGNOSIS — S299XXA Unspecified injury of thorax, initial encounter: Secondary | ICD-10-CM | POA: Diagnosis not present

## 2023-07-19 DIAGNOSIS — M25512 Pain in left shoulder: Secondary | ICD-10-CM | POA: Diagnosis not present

## 2023-07-19 DIAGNOSIS — Z043 Encounter for examination and observation following other accident: Secondary | ICD-10-CM | POA: Diagnosis not present

## 2023-07-19 DIAGNOSIS — S43015A Anterior dislocation of left humerus, initial encounter: Secondary | ICD-10-CM | POA: Diagnosis not present

## 2023-07-19 DIAGNOSIS — R9389 Abnormal findings on diagnostic imaging of other specified body structures: Secondary | ICD-10-CM | POA: Diagnosis not present

## 2023-07-19 DIAGNOSIS — D72829 Elevated white blood cell count, unspecified: Secondary | ICD-10-CM | POA: Diagnosis not present

## 2023-07-19 DIAGNOSIS — M189 Osteoarthritis of first carpometacarpal joint, unspecified: Secondary | ICD-10-CM | POA: Diagnosis not present

## 2023-07-28 DIAGNOSIS — M25512 Pain in left shoulder: Secondary | ICD-10-CM | POA: Diagnosis not present

## 2023-08-05 DIAGNOSIS — M25512 Pain in left shoulder: Secondary | ICD-10-CM | POA: Diagnosis not present

## 2023-08-07 DIAGNOSIS — H3589 Other specified retinal disorders: Secondary | ICD-10-CM | POA: Diagnosis not present

## 2023-08-07 DIAGNOSIS — C6932 Malignant neoplasm of left choroid: Secondary | ICD-10-CM | POA: Diagnosis not present

## 2023-08-07 DIAGNOSIS — H4312 Vitreous hemorrhage, left eye: Secondary | ICD-10-CM | POA: Diagnosis not present

## 2023-08-07 DIAGNOSIS — H25813 Combined forms of age-related cataract, bilateral: Secondary | ICD-10-CM | POA: Diagnosis not present

## 2023-08-07 DIAGNOSIS — H43812 Vitreous degeneration, left eye: Secondary | ICD-10-CM | POA: Diagnosis not present

## 2023-08-07 DIAGNOSIS — T66XXXS Radiation sickness, unspecified, sequela: Secondary | ICD-10-CM | POA: Diagnosis not present

## 2023-08-08 ENCOUNTER — Ambulatory Visit: Payer: Medicare PPO | Admitting: Podiatry

## 2023-08-08 DIAGNOSIS — H9193 Unspecified hearing loss, bilateral: Secondary | ICD-10-CM | POA: Diagnosis not present

## 2023-08-08 DIAGNOSIS — H9113 Presbycusis, bilateral: Secondary | ICD-10-CM | POA: Diagnosis not present

## 2023-08-08 DIAGNOSIS — H903 Sensorineural hearing loss, bilateral: Secondary | ICD-10-CM | POA: Diagnosis not present

## 2023-08-09 DIAGNOSIS — M25512 Pain in left shoulder: Secondary | ICD-10-CM | POA: Diagnosis not present

## 2023-08-12 DIAGNOSIS — M25512 Pain in left shoulder: Secondary | ICD-10-CM | POA: Diagnosis not present

## 2023-08-13 ENCOUNTER — Ambulatory Visit: Payer: Medicare PPO | Admitting: Podiatry

## 2023-08-13 DIAGNOSIS — T148XXA Other injury of unspecified body region, initial encounter: Secondary | ICD-10-CM | POA: Diagnosis not present

## 2023-08-13 DIAGNOSIS — M75122 Complete rotator cuff tear or rupture of left shoulder, not specified as traumatic: Secondary | ICD-10-CM | POA: Diagnosis not present

## 2023-08-14 ENCOUNTER — Telehealth: Payer: Self-pay | Admitting: *Deleted

## 2023-08-14 NOTE — Telephone Encounter (Signed)
Pt has been scheduled for tele pre op appt 08/26/23 9:20. Med rec and consent are done.     Patient Consent for Virtual Visit        Olivia Espinoza has provided verbal consent on 08/14/2023 for a virtual visit (video or telephone).   CONSENT FOR VIRTUAL VISIT FOR:  Olivia Espinoza  By participating in this virtual visit I agree to the following:  I hereby voluntarily request, consent and authorize [facility] and its employed or contracted physicians, physician assistants, nurse practitioners or other licensed health care professionals (the Practitioner), to provide me with telemedicine health care services (the "Services") as deemed necessary by the treating Practitioner. I acknowledge and consent to receive the Services by the Practitioner via telemedicine. I understand that the telemedicine visit will involve communicating with the Practitioner through live audiovisual communication technology and the disclosure of certain medical information by electronic transmission. I acknowledge that I have been given the opportunity to request an in-person assessment or other available alternative prior to the telemedicine visit and am voluntarily participating in the telemedicine visit.  I understand that I have the right to withhold or withdraw my consent to the use of telemedicine in the course of my care at any time, without affecting my right to future care or treatment, and that the Practitioner or I may terminate the telemedicine visit at any time. I understand that I have the right to inspect all information obtained and/or recorded in the course of the telemedicine visit and may receive copies of available information for a reasonable fee.  I understand that some of the potential risks of receiving the Services via telemedicine include:  Delay or interruption in medical evaluation due to technological equipment failure or disruption; Information transmitted may not be sufficient (e.g.  poor resolution of images) to allow for appropriate medical decision making by the Practitioner; and/or  In rare instances, security protocols could fail, causing a breach of personal health information.  Furthermore, I acknowledge that it is my responsibility to provide information about my medical history, conditions and care that is complete and accurate to the best of my ability. I acknowledge that Practitioner's advice, recommendations, and/or decision may be based on factors not within their control, such as incomplete or inaccurate data provided by me or distortions of diagnostic images or specimens that may result from electronic transmissions. I understand that the practice of medicine is not an exact science and that Practitioner makes no warranties or guarantees regarding treatment outcomes. I acknowledge that a copy of this consent can be made available to me via my patient portal [facility] MyChart), or I can request a printed copy by calling the office of [facility].    I understand that my insurance will be billed for this visit.   I have read or had this consent read to me. I understand the contents of this consent, which adequately explains the benefits and risks of the Services being provided via telemedicine.  I have been provided ample opportunity to ask questions regarding this consent and the Services and have had my questions answered to my satisfaction. I give my informed consent for the services to be provided through the use of telemedicine in my medical care

## 2023-08-14 NOTE — Telephone Encounter (Signed)
   Pre-operative Risk Assessment    Patient Name: Olivia Espinoza  DOB: 1941/08/01 MRN: [id number]      Request for Surgical Clearance    Procedure:   LEFT REVERSE TOTAL SHOULDER ARTHROPLASTY  Date of Surgery:  Clearance TBD                                 Surgeon:  DR. Malon Kindle Surgeon's Group or Practice Name:  [facility] Phone number:  [phone] ATTN: KERRI MAZE Fax number:  [phone]   Type of Clearance Requested:   - Medical ; ASA    Type of Anesthesia:   CHOICE   Additional requests/questions:    Elpidio Anis   08/14/2023, 11:21 AM

## 2023-08-14 NOTE — Telephone Encounter (Signed)
Left message to call back to schedule a tele pre op appt.  ?

## 2023-08-14 NOTE — Telephone Encounter (Signed)
Patient is returning call and is requesting call back.  

## 2023-08-14 NOTE — Telephone Encounter (Signed)
   Name: Olivia Espinoza  DOB: 11/09/1941  MRN: [id number]  Primary Cardiologist: Thurmon Fair, MD   Preoperative team, please contact this patient and set up a phone call appointment for further preoperative risk assessment. Please obtain consent and complete medication review. Thank you for your help.  I confirm that guidance regarding antiplatelet and oral anticoagulation therapy has been completed and, if necessary, noted below.  Cardiology does not prescribe patient's aspirin.  Recommendations for holding aspirin will need to come from prescribing provider.   Ronney Asters, NP 08/14/2023, 11:49 AM [facility]

## 2023-08-14 NOTE — Telephone Encounter (Signed)
Pt has been scheduled for tele pre op appt 08/26/23 9:20. Med rec and consent are done.

## 2023-08-21 DIAGNOSIS — M25512 Pain in left shoulder: Secondary | ICD-10-CM | POA: Diagnosis not present

## 2023-08-21 DIAGNOSIS — Z01818 Encounter for other preprocedural examination: Secondary | ICD-10-CM | POA: Diagnosis not present

## 2023-08-21 DIAGNOSIS — I700 Atherosclerosis of aorta: Secondary | ICD-10-CM | POA: Diagnosis not present

## 2023-08-25 ENCOUNTER — Ambulatory Visit: Payer: Medicare PPO | Admitting: Podiatry

## 2023-08-25 ENCOUNTER — Encounter: Payer: Self-pay | Admitting: Podiatry

## 2023-08-25 DIAGNOSIS — M79675 Pain in left toe(s): Secondary | ICD-10-CM | POA: Diagnosis not present

## 2023-08-25 DIAGNOSIS — M79674 Pain in right toe(s): Secondary | ICD-10-CM | POA: Diagnosis not present

## 2023-08-25 DIAGNOSIS — B351 Tinea unguium: Secondary | ICD-10-CM | POA: Diagnosis not present

## 2023-08-25 NOTE — Progress Notes (Signed)
This patient presents to the office with chief complaint of long thick painful big nails.  Patient says the nails are painful walking and wearing shoes.  This patient is unable to self treat.  This patient is unable to trim her nails since she is unable to reach her nails. And she had shoulder injury. She presents to the office for preventative foot care services.  General Appearance  Alert, conversant and in no acute stress.  Vascular  Dorsalis pedis and posterior tibial  pulses are palpable  bilaterally.  Capillary return is within normal limits  bilaterally. Temperature is within normal limits  bilaterally.  Neurologic  Senn-Weinstein monofilament wire test within normal limits  bilaterally. Muscle power within normal limits bilaterally.  Nails Thick disfigured discolored nails with subungual debris  hallux nails  toes bilaterally. No evidence of bacterial infection or drainage bilaterally.  Orthopedic  No limitations of motion  feet .  No crepitus or effusions noted.  No bony pathology or digital deformities noted.  Skin  normotropic skin with no porokeratosis noted bilaterally.  No signs of infections or ulcers noted.     Onychomycosis  Nails  B/L.  Pain in right toes  Pain in left toes  Debridement of nails both feet followed trimming the nails with dremel tool.    RTC 3 months.   Helane Gunther DPM

## 2023-08-26 ENCOUNTER — Ambulatory Visit: Payer: Medicare PPO | Attending: Cardiovascular Disease | Admitting: Student

## 2023-08-26 DIAGNOSIS — Z01810 Encounter for preprocedural cardiovascular examination: Secondary | ICD-10-CM

## 2023-08-26 NOTE — Progress Notes (Signed)
Virtual Visit via Telephone Note   Because of Olivia Espinoza's co-morbid illnesses, she is at least at moderate risk for complications without adequate follow up.  This format is felt to be most appropriate for this patient at this time.  The patient did not have access to video technology/had technical difficulties with video requiring transitioning to audio format only (telephone).  All issues noted in this document were discussed and addressed.  No physical exam could be performed with this format.  Please refer to the patient's chart for her consent to telehealth for [facility].  Evaluation Performed:  Preoperative cardiovascular risk assessment _____________   Date:  08/26/2023   Patient ID:  Olivia Espinoza, DOB June 08, 1941, MRN [id number] Patient Location:  Home Provider location:   Office  Primary Care Provider:  Ileana Ladd, MD (Inactive) Primary Cardiologist:  Olivia Fair, MD  Chief Complaint / Patient Profile   82 y.o. y/o female with a h/o nonobstructive CAD per coronary CTA, hypertension, hyperlipidemia, palpitations, hypothyroidism who is pending left reverse total shoulder arthroplasty by Dr. Ranell Espinoza and presents today for telephonic preoperative cardiovascular risk assessment.  History of Present Illness    Olivia Espinoza is a 82 y.o. female who presents via audio/video conferencing for a telehealth visit today.  Pt was last seen in cardiology clinic on 03/24/2023 by Dr. Royann Espinoza.  At that time Olivia Espinoza was stable from a cardiac standpoint.  The patient is now pending procedure as outlined above. Since her last visit, she is doing well. Patient denies shortness of breath or dyspnea on exertion. No chest pain, pressure, or tightness. Denies orthopnea or PND. She has minimal lower extremity edema. No palpitations.  She is active gardening. Since hurting her shoulder she plans to start walking for exercise while she recovers.   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   Paraspinal mass    PONV (postoperative nausea and vomiting)    Seasonal allergies    Past Surgical History:  Procedure Laterality Date   BTL miscarrage     RESECTION OF MEDIASTINAL MASS Right 03/25/2016   Procedure: RESECTION OF MASS RIGHT THORACIC INLET VIA SUPRACLAVICULAR APPROACH;  Surgeon: Olivia Slot, MD;  Location: [facility];  Service: Thoracic;  Laterality: Right;   TUBAL LIGATION      Allergies  Allergies  Allergen Reactions   Chlorhexidine Gluconate Itching   Levofloxacin Other (See Comments)    Made her feel like someone hit her in the stomach with a bat   Motrin [Ibuprofen] Hives    Can take aspirin   Rosuvastatin Other (See Comments)    Other reaction(s): Myalgias (intolerance)   Ciprofloxacin     Other reaction(s): Patient is not sure she is even allergic to this   Ezetimibe     Other reaction(s): questionable rash but was lisinopril   Lisinopril     Other reaction(s): rash   Ceclor [Cefaclor] Rash    Prefers amoxicillin per pt    Home Medications    Prior to Admission medications   Medication Sig Start Date End Date Taking? Authorizing Provider  amLODipine (NORVASC) 5 MG tablet Take 5 mg by mouth daily in the afternoon. 07/27/21   [provider]  aspirin EC 81 MG tablet Take 81 mg by mouth daily.    [provider]  cetirizine (ZYRTEC) 10 MG tablet Take 10 mg by mouth daily as needed for allergies. 11/18/15   [provider]  fluticasone (FLONASE) 50 MCG/ACT nasal spray Place 2 sprays into both nostrils daily as needed for allergies. 02/24/15   [provider]  levothyroxine (SYNTHROID) 75 MCG tablet Take by mouth daily before breakfast.  07/01/14   [provider]  melatonin 5 MG TABS Take 5 mg by mouth at bedtime as needed.    [provider]  Multiple Vitamin  (MULTIVITAMIN WITH MINERALS) TABS tablet Take 1 tablet by mouth daily.    [provider]  REPATHA SURECLICK 140 MG/ML SOAJ ADMINISTER 1 ML(140MG ) UNDER THE SKIN EVERY 14 DAYS 02/10/23   Croitoru, Olivia Hora, MD    Physical Exam    Vital Signs:  Olivia Espinoza does not have vital signs available for review today.  Given telephonic nature of communication, physical exam is limited. AAOx3. NAD. Normal affect.  Speech and respirations are unlabored.  Accessory Clinical Findings    None  Assessment & Plan    Primary Cardiologist: Olivia Fair, MD  Preoperative cardiovascular risk assessment. Left reverse total shoulder arthroplasty by Dr. Ranell Espinoza.   Chart reviewed as part of pre-operative protocol coverage. According to the RCRI, patient has a 0.4% risk of MACE. Patient reports activity equivalent to 4.0 METS (gardening and walking).   Given past medical history and time since last visit, based on ACC/AHA guidelines, Olivia Espinoza would be at acceptable risk for the planned procedure without further cardiovascular testing.   Patient was advised that if she develops new symptoms prior to surgery to contact our office to arrange a follow-up appointment.  she verbalized understanding.  Ideally aspirin should be continued without interruption, however if the bleeding risk is too great, aspirin may be held for 5-7 days prior to surgery. Please resume aspirin post operatively when it is felt to be safe from a bleeding standpoint.    I will route this recommendation to the requesting party via Epic fax function.  Please call with questions.  Time:   Today, I have spent 6 minutes with the patient with telehealth technology discussing medical history, symptoms, and management plan.     Olivia Levering, NP  08/26/2023, 7:27 AM

## 2023-09-11 DIAGNOSIS — M75122 Complete rotator cuff tear or rupture of left shoulder, not specified as traumatic: Secondary | ICD-10-CM | POA: Diagnosis not present

## 2023-09-11 DIAGNOSIS — T148XXA Other injury of unspecified body region, initial encounter: Secondary | ICD-10-CM | POA: Diagnosis not present

## 2023-09-18 NOTE — H&P (Signed)
Patient's anticipated LOS is less than 2 midnights, meeting these requirements: - Younger than 73 - Lives within 1 hour of care - Has a competent adult at home to recover with post-op recover - NO history of  - Chronic pain requiring opiods  - Diabetes  - Coronary Artery Disease  - Heart failure  - Heart attack  - Stroke  - DVT/VTE  - Cardiac arrhythmia  - Respiratory Failure/COPD  - Renal failure  - Anemia  - Advanced Liver disease     Olivia Espinoza is an 82 y.o. female.    Chief Complaint: left shoulder pain  HPI: Pt is a 82 y.o. female complaining of left shoulder pain for multiple years. Pain had continually increased since the beginning. X-rays in the clinic show end-stage arthritic changes of the left shoulder. Pt has tried various conservative treatments which have failed to alleviate their symptoms, including injections and therapy. Various options are discussed with the patient. Risks, benefits and expectations were discussed with the patient. Patient understand the risks, benefits and expectations and wishes to proceed with surgery.   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 She has never used smokeless tobacco. She reports current alcohol use. She reports that she does not use drugs. BMI: Estimated body mass index is 23.05 kg/m as calculated from the  following:   Height as of 03/24/23: 5' 1.5" (1.562 m).   Weight as of 03/24/23: 56.2 kg.  Lab Results  Component Value Date   ALBUMIN 4.1 05/06/2022   Diabetes: Patient does not have a diagnosis of diabetes.     Smoking Status:   reports that she has never smoked. She has never used smokeless tobacco.    Allergies:  Allergies  Allergen Reactions   Chlorhexidine Gluconate Itching   Levofloxacin Other (See Comments)    Made her feel like someone hit her in the stomach with a bat   Motrin [Ibuprofen] Hives    Can take aspirin   Rosuvastatin Other (See Comments)    Other reaction(s): Myalgias (intolerance)   Ciprofloxacin     Other reaction(s): Patient is not sure she is even allergic to this   Ezetimibe     Other reaction(s): questionable rash but was lisinopril   Lisinopril     Other reaction(s): rash   Ceclor [Cefaclor] Rash    Prefers amoxicillin per pt    Medications: No current facility-administered medications for this encounter.   Current Outpatient Medications  Medication Sig Dispense Refill   amLODipine (NORVASC) 5 MG tablet Take 5 mg by mouth daily in the afternoon.     aspirin EC 81 MG tablet Take 81 mg by mouth daily.     cetirizine (ZYRTEC) 10 MG tablet Take 10 mg by mouth daily as needed for  allergies.     fluticasone (FLONASE) 50 MCG/ACT nasal spray Place 2 sprays into both nostrils daily as needed for allergies.     levothyroxine (SYNTHROID) 75 MCG tablet Take by mouth daily before breakfast.      melatonin 5 MG TABS Take 5 mg by mouth at bedtime as needed.     Multiple Vitamin (MULTIVITAMIN WITH MINERALS) TABS tablet Take 1 tablet by mouth daily.     REPATHA SURECLICK 140 MG/ML SOAJ ADMINISTER 1 ML(140MG ) UNDER THE SKIN EVERY 14 DAYS 2 mL 11    No results found for this or any previous visit (from the past 48 hour(s)). No results found.  ROS: Pain with rom of the left upper extremity  Physical Exam: Alert and oriented 81 y.o. female in no acute  distress Cranial nerves 2-12 intact Cervical spine: full rom with no tenderness, nv intact distally Chest: active breath sounds bilaterally, no wheeze rhonchi or rales Heart: regular rate and rhythm, no murmur Abd: non tender non distended with active bowel sounds Hip is stable with rom  Left shoulder painful and weak rom Nv intact distally No rashes or edema   Assessment/Plan Assessment: left shoulder cuff arthropathy  Plan:  Patient will undergo a left reverse total shoulder by Dr. Ranell Patrick at [place] [place] Risks benefits and expectations were discussed with the patient. Patient understand risks, benefits and expectations and wishes to proceed. Preoperative templating of the joint replacement has been completed, documented, and submitted to the Operating Room personnel in order to optimize intra-operative equipment management.   Alphonsa Overall PA-C, MPAS [facility] is now [employer] [address], [address], [place], [place] [address] Phone: [phone] [email or web address] Facebook  [employer]

## 2023-09-22 DIAGNOSIS — G5692 Unspecified mononeuropathy of left upper limb: Secondary | ICD-10-CM | POA: Diagnosis not present

## 2023-09-22 DIAGNOSIS — M25512 Pain in left shoulder: Secondary | ICD-10-CM | POA: Diagnosis not present

## 2023-09-22 DIAGNOSIS — M75122 Complete rotator cuff tear or rupture of left shoulder, not specified as traumatic: Secondary | ICD-10-CM | POA: Diagnosis not present

## 2023-09-24 DIAGNOSIS — M75122 Complete rotator cuff tear or rupture of left shoulder, not specified as traumatic: Secondary | ICD-10-CM | POA: Diagnosis not present

## 2023-09-24 DIAGNOSIS — M25512 Pain in left shoulder: Secondary | ICD-10-CM | POA: Diagnosis not present

## 2023-09-24 NOTE — Patient Instructions (Addendum)
SURGICAL WAITING ROOM VISITATION  Patients having surgery or a procedure may have no more than 2 support people in the waiting area - these visitors may rotate.    Children under the age of 3 must have an adult with them who is not the patient.  Due to an increase in RSV and influenza rates and associated hospitalizations, children ages 40 and under may not visit patients in [facility] hospitals.  If the patient needs to stay at the hospital during part of their recovery, the visitor guidelines for inpatient rooms apply. Pre-op nurse will coordinate an appropriate time for 1 support person to accompany patient in pre-op.  This support person may not rotate.    Please refer to the [facility] website for the visitor guidelines for Inpatients (after your surgery is over and you are in a regular room).       Your procedure is scheduled on: 10/10/23   Report to [facility] Main Entrance    Report to admitting at 12;30 pm   Call this number if you have problems the morning of surgery [phone]   Do not eat food :After Midnight.   After Midnight you may have the following liquids until 12 Noon  DAY OF SURGERY  Water Non-Citrus Juices (without pulp, NO RED-Apple, White grape, White cranberry) Black Coffee (NO MILK/CREAM OR CREAMERS, sugar ok)  Clear Tea (NO MILK/CREAM OR CREAMERS, sugar ok) regular and decaf                             Plain Jell-O (NO RED)                                           Fruit ices (not with fruit pulp, NO RED)                                     Popsicles (NO RED)                                                               Sports drinks like Gatorade (NO RED)                The day of surgery:  Drink ONE (1) Pre-Surgery Clear Ensure at 12 Noon the morning of surgery. Drink in one sitting. Do not sip.  This drink was given to you during your hospital  pre-op appointment visit. Nothing else to drink after completing the  Pre-Surgery Clear  Ensure   Oral Hygiene is also important to reduce your risk of infection.                                    Remember - BRUSH YOUR TEETH THE MORNING OF SURGERY WITH YOUR REGULAR TOOTHPASTE   Stop all vitamins and herbal supplements 7 days before surgery.   Take these medicines the morning of surgery : Amlodipine, synthroid, eye drops, Nasal spray  You may not have any metal on your body including hair pins, jewelry, and body piercing             Do not wear make-up, lotions, powders, perfumes, or deodorant  Do not wear nail polish including gel and S&S, artificial/acrylic nails, or any other type of covering on natural nails including finger and toenails. If you have artificial nails, gel coating, etc. that needs to be removed by a nail salon please have this removed prior to surgery or surgery may need to be canceled/ delayed if the surgeon/ anesthesia feels like they are unable to be safely monitored.   Do not shave  48 hours prior to surgery.    Do not bring valuables to the hospital. [facility] IS NOT             RESPONSIBLE   FOR VALUABLES.   Contacts, glasses, dentures or bridgework may not be worn into surgery.   Bring small overnight bag day of surgery.   DO NOT BRING YOUR HOME MEDICATIONS TO THE HOSPITAL. PHARMACY WILL DISPENSE MEDICATIONS LISTED ON YOUR MEDICATION LIST TO YOU DURING YOUR ADMISSION IN THE HOSPITAL!    Patients discharged on the day of surgery will not be allowed to drive home.  Someone NEEDS to stay with you for the first 24 hours after anesthesia.   Special Instructions: Bring a copy of your healthcare power of attorney and living will documents the day of surgery if you haven't scanned them before.              Please read over the following fact sheets you were given: IF YOU HAVE QUESTIONS ABOUT YOUR PRE-OP INSTRUCTIONS PLEASE CALL [phone] Olivia Espinoza   If you received a COVID test during your pre-op visit  it is requested  that you wear a mask when out in public, stay away from anyone that may not be feeling well and notify your surgeon if you develop symptoms. If you test positive for Covid or have been in contact with anyone that has tested positive in the last 10 days please notify you surgeon.      Pre-operative 5 CHG Espinoza Instructions   You can play a key role in reducing the risk of infection after surgery. Your skin needs to be as free of germs as possible. You can reduce the number of germs on your skin by washing with CHG (chlorhexidine gluconate) soap before surgery. CHG is an antiseptic soap that kills germs and continues to kill germs even after washing.   DO NOT use if you have an allergy to chlorhexidine/CHG or antibacterial soaps. If your skin becomes reddened or irritated, stop using the CHG and notify one of our RNs at [phone].   Please shower with the CHG soap starting 4 days before surgery using the following schedule:     Please keep in mind the following:  DO NOT shave, including legs and underarms, starting the day of your first shower.   You may shave your face at any point before/day of surgery.  Place clean sheets on your bed the day you start using CHG soap. Use a clean washcloth (not used since being washed) for each shower. DO NOT sleep with pets once you start using the CHG.   CHG Shower Instructions:  If you choose to wash your hair and private area, wash first with your normal shampoo/soap.  After you use shampoo/soap, rinse your hair and body thoroughly to remove shampoo/soap residue.  Turn the  water OFF and apply about 3 tablespoons (45 ml) of CHG soap to a CLEAN washcloth.  Apply CHG soap ONLY FROM YOUR NECK DOWN TO YOUR TOES (washing for 3-5 minutes)  DO NOT use CHG soap on face, private areas, open wounds, or sores.  Pay special attention to the area where your surgery is being performed.  If you are having back surgery, having someone wash your back for you may be  helpful. Wait 2 minutes after CHG soap is applied, then you may rinse off the CHG soap.  Pat dry with a clean towel  Put on clean clothes/pajamas   If you choose to wear lotion, please use ONLY the CHG-compatible lotions on the back of this paper.     Additional instructions for the day of surgery: DO NOT APPLY any lotions, deodorants, cologne, or perfumes.   Put on clean/comfortable clothes.  Brush your teeth.  Ask your nurse before applying any prescription medications to the skin.      CHG Compatible Lotions   Aveeno Moisturizing lotion  Cetaphil Moisturizing Cream  Cetaphil Moisturizing Lotion  Clairol Herbal Essence Moisturizing Lotion, Dry Skin  Clairol Herbal Essence Moisturizing Lotion, Extra Dry Skin  Clairol Herbal Essence Moisturizing Lotion, Normal Skin  Curel Age Defying Therapeutic Moisturizing Lotion with Alpha Hydroxy  Curel Extreme Care Body Lotion  Curel Soothing Hands Moisturizing Hand Lotion  Curel Therapeutic Moisturizing Cream, Fragrance-Free  Curel Therapeutic Moisturizing Lotion, Fragrance-Free  Curel Therapeutic Moisturizing Lotion, Original Formula  Eucerin Daily Replenishing Lotion  Eucerin Dry Skin Therapy Plus Alpha Hydroxy Crme  Eucerin Dry Skin Therapy Plus Alpha Hydroxy Lotion  Eucerin Original Crme  Eucerin Original Lotion  Eucerin Plus Crme Eucerin Plus Lotion  Eucerin TriLipid Replenishing Lotion  Keri Anti-Bacterial Hand Lotion  Keri Deep Conditioning Original Lotion Dry Skin Formula Softly Scented  Keri Deep Conditioning Original Lotion, Fragrance Free Sensitive Skin Formula  Keri Lotion Fast Absorbing Fragrance Free Sensitive Skin Formula  Keri Lotion Fast Absorbing Softly Scented Dry Skin Formula  Keri Original Lotion  Keri Skin Renewal Lotion Keri Silky Smooth Lotion  Keri Silky Smooth Sensitive Skin Lotion  Nivea Body Creamy Conditioning Oil  Nivea Body Extra Enriched Lotion  Nivea Body Original Lotion  Nivea Body Sheer  Moisturizing Lotion Nivea Crme  Nivea Skin Firming Lotion  NutraDerm 30 Skin Lotion  NutraDerm Skin Lotion  NutraDerm Therapeutic Skin Cream  NutraDerm Therapeutic Skin Lotion  ProShield Protective Hand Cream   Incentive Spirometer (Watch this video at home: [email or web address])  An incentive spirometer is a tool that can help keep your lungs clear and active. This tool measures how well you are filling your lungs with each breath. Taking long deep breaths may help reverse or decrease the chance of developing breathing (pulmonary) problems (especially infection) following: A long period of time when you are unable to move or be active. BEFORE THE PROCEDURE  If the spirometer includes an indicator to show your best effort, your nurse or respiratory therapist will set it to a desired goal. If possible, sit up straight or lean slightly forward. Try not to slouch. Hold the incentive spirometer in an upright position. INSTRUCTIONS FOR USE  Sit on the edge of your bed if possible, or sit up as far as you can in bed or on a chair. Hold the incentive spirometer in an upright position. Breathe out normally. Place the mouthpiece in your mouth and seal your lips tightly around it. Breathe in slowly and as deeply as possible, raising  the piston or the ball toward the top of the column. Hold your breath for 3-5 seconds or for as long as possible. Allow the piston or ball to fall to the bottom of the column. Remove the mouthpiece from your mouth and breathe out normally. Rest for a few seconds and repeat Steps 1 through 7 at least 10 times every 1-2 hours when you are awake. Take your time and take a few normal breaths between deep breaths. The spirometer may include an indicator to show your best effort. Use the indicator as a goal to work toward during each repetition. After each set of 10 deep breaths, practice coughing to be sure your lungs are clear. If you have an incision  (the cut made at the time of surgery), support your incision when coughing by placing a pillow or rolled up towels firmly against it. Once you are able to get out of bed, walk around indoors and cough well. You may stop using the incentive spirometer when instructed by your caregiver.  RISKS AND COMPLICATIONS Take your time so you do not get dizzy or light-headed. If you are in pain, you may need to take or ask for pain medication before doing incentive spirometry. It is harder to take a deep breath if you are having pain. AFTER USE Rest and breathe slowly and easily. It can be helpful to keep track of a log of your progress. Your caregiver can provide you with a simple table to help with this. If you are using the spirometer at home, follow these instructions: SEEK MEDICAL CARE IF:  You are having difficultly using the spirometer. You have trouble using the spirometer as often as instructed. Your pain medication is not giving enough relief while using the spirometer. You develop fever of 100.5 F (38.1 C) or higher. SEEK IMMEDIATE MEDICAL CARE IF:  You cough up bloody sputum that had not been present before. You develop fever of 102 F (38.9 C) or greater. You develop worsening pain at or near the incision site. MAKE SURE YOU:  Understand these instructions. Will watch your condition. Will get help right away if you are not doing well or get worse.   [place]- Preparing for Total Shoulder Arthroplasty    Before surgery, you can play an important role. Because skin is not sterile, your skin needs to be as free of germs as possible. You can reduce the number of germs on your skin by using the following products. Benzoyl Peroxide Gel Reduces the number of germs present on the skin Applied twice a day to shoulder area starting two days before surgery    ==================================================================  Please follow these instructions carefully:  BENZOYL  PEROXIDE 5% GEL  Please do not use if you have an allergy to benzoyl peroxide.   If your skin becomes reddened/irritated stop using the benzoyl peroxide.  Starting two days before surgery, apply as follows: Apply benzoyl peroxide in the morning and at night. Apply after taking a shower. If you are not taking a shower clean entire shoulder front, back, and side along with the armpit with a clean wet washcloth.  Place a quarter-sized dollop on your shoulder and rub in thoroughly, making sure to cover the front, back, and side of your shoulder, along with the armpit.   2 days before ____ AM   ____ PM              1 day before ____ AM   ____ PM  Do this twice a day for two days.  (Last application is the night before surgery, AFTER using the CHG soap as described below).  Do NOT apply benzoyl peroxide gel on the day of surgery.

## 2023-09-24 NOTE — Progress Notes (Addendum)
COVID Vaccine received:  []  No [x]  Yes Date of any COVID positive Test in last 90 days: no PCP - Dr.Ryan Vincente Poli Cardiologist - Thurmon Fair MD  Cardiac clearance-08/26/23- Carlos Levering NP  Chest x-ray -  EKG -  03/24/23 Epic  ECHO -  Cardiac Cath -  Stress Test-01/10/17 Epic  Bowel Prep - [x]  No  []   Yes ______  Pacemaker / ICD device [x]  No []  Yes   Spinal Cord Stimulator:[x]  No []  Yes       History of Sleep Apnea? [x]  No []  Yes   CPAP used?- [x]  No []  Yes    Does the patient monitor blood sugar?          [x]  No []  Yes  []  N/A  Patient has: [x]  NO Hx DM   []  Pre-DM                 []  DM1  []   DM2 Does patient have a [employer] or Dexacom? []  No []  Yes   Fasting Blood Sugar Ranges-  Checks Blood Sugar _____ times a day  GLP1 agonist / usual dose - no GLP1 instructions:  SGLT-2 inhibitors / usual dose - no SGLT-2 instructions:   Blood Thinner / Instructions: Aspirin Instructions:Has been taking ASA 325mg  daily for pain because allergic to Motrin. She will stop taking it 5 days prior to surgery and try Tylenol if she has pain.  Comments:   Activity level: Patient is able to climb a flight of stairs without difficulty; [x]  No CP  [x]  No SOB,   Patient can  perform ADLs without assistance.   Anesthesia review:   Patient denies shortness of breath, fever, cough and chest pain at PAT appointment.  Patient verbalized understanding and agreement to the Pre-Surgical Instructions that were given to them at this PAT appointment. Patient was also educated of the need to review these PAT instructions again prior to his/her surgery.I reviewed the appropriate phone numbers to call if they have any and questions or concerns.

## 2023-09-29 ENCOUNTER — Other Ambulatory Visit: Payer: Self-pay

## 2023-09-29 ENCOUNTER — Encounter (HOSPITAL_COMMUNITY)
Admission: RE | Admit: 2023-09-29 | Discharge: 2023-09-29 | Disposition: A | Discharge status: Home / Self Care | Payer: Medicare PPO | Source: Ambulatory Visit | Attending: Orthopedic Surgery | Admitting: Orthopedic Surgery

## 2023-09-29 ENCOUNTER — Encounter (HOSPITAL_COMMUNITY): Payer: Self-pay

## 2023-09-29 VITALS — BP 161/82 | HR 73 | Temp 98.2°F | Resp 16 | Ht 61.0 in | Wt 126.0 lb

## 2023-09-29 DIAGNOSIS — Z01818 Encounter for other preprocedural examination: Secondary | ICD-10-CM

## 2023-09-29 DIAGNOSIS — Z01812 Encounter for preprocedural laboratory examination: Secondary | ICD-10-CM | POA: Diagnosis not present

## 2023-09-29 DIAGNOSIS — I10 Essential (primary) hypertension: Secondary | ICD-10-CM | POA: Insufficient documentation

## 2023-09-29 LAB — CBC
HCT: 42.8 % (ref 36.0–46.0)
Hemoglobin: 13.7 g/dL (ref 12.0–15.0)
MCH: 31.1 pg (ref 26.0–34.0)
MCHC: 32 g/dL (ref 30.0–36.0)
MCV: 97.3 fL (ref 80.0–100.0)
Platelets: 351 10*3/uL (ref 150–400)
RBC: 4.4 MIL/uL (ref 3.87–5.11)
RDW: 12.9 % (ref 11.5–15.5)
WBC: 10.6 10*3/uL — ABNORMAL HIGH (ref 4.0–10.5)
nRBC: 0 % (ref 0.0–0.2)

## 2023-09-29 LAB — BASIC METABOLIC PANEL
Anion gap: 8 (ref 5–15)
BUN: 22 mg/dL (ref 8–23)
CO2: 26 mmol/L (ref 22–32)
Calcium: 9.3 mg/dL (ref 8.9–10.3)
Chloride: 103 mmol/L (ref 98–111)
Creatinine, Ser: 0.92 mg/dL (ref 0.44–1.00)
GFR, Estimated: >60 mL/min (ref >60)
Glucose, Bld: 88 mg/dL (ref 70–99)
Potassium: 3.8 mmol/L (ref 3.5–5.1)
Sodium: 137 mmol/L (ref 135–145)

## 2023-09-29 LAB — SURGICAL PCR SCREEN
MRSA, PCR: NEGATIVE
Staphylococcus aureus: NEGATIVE

## 2023-09-30 DIAGNOSIS — M25512 Pain in left shoulder: Secondary | ICD-10-CM | POA: Diagnosis not present

## 2023-09-30 DIAGNOSIS — M75122 Complete rotator cuff tear or rupture of left shoulder, not specified as traumatic: Secondary | ICD-10-CM | POA: Diagnosis not present

## 2023-10-02 DIAGNOSIS — M25512 Pain in left shoulder: Secondary | ICD-10-CM | POA: Diagnosis not present

## 2023-10-03 DIAGNOSIS — M75122 Complete rotator cuff tear or rupture of left shoulder, not specified as traumatic: Secondary | ICD-10-CM | POA: Diagnosis not present

## 2023-10-03 DIAGNOSIS — M25512 Pain in left shoulder: Secondary | ICD-10-CM | POA: Diagnosis not present

## 2023-10-07 DIAGNOSIS — M25512 Pain in left shoulder: Secondary | ICD-10-CM | POA: Diagnosis not present

## 2023-10-07 DIAGNOSIS — M75122 Complete rotator cuff tear or rupture of left shoulder, not specified as traumatic: Secondary | ICD-10-CM | POA: Diagnosis not present

## 2023-10-09 DIAGNOSIS — I10 Essential (primary) hypertension: Secondary | ICD-10-CM | POA: Diagnosis not present

## 2023-10-09 DIAGNOSIS — E039 Hypothyroidism, unspecified: Secondary | ICD-10-CM | POA: Diagnosis not present

## 2023-10-09 DIAGNOSIS — M25512 Pain in left shoulder: Secondary | ICD-10-CM | POA: Diagnosis not present

## 2023-10-09 DIAGNOSIS — M75122 Complete rotator cuff tear or rupture of left shoulder, not specified as traumatic: Secondary | ICD-10-CM | POA: Diagnosis not present

## 2023-10-09 DIAGNOSIS — E785 Hyperlipidemia, unspecified: Secondary | ICD-10-CM | POA: Diagnosis not present

## 2023-10-09 DIAGNOSIS — I700 Atherosclerosis of aorta: Secondary | ICD-10-CM | POA: Diagnosis not present

## 2023-10-10 ENCOUNTER — Encounter (HOSPITAL_COMMUNITY): Admission: RE | Payer: Self-pay | Source: Ambulatory Visit

## 2023-10-10 ENCOUNTER — Ambulatory Visit (HOSPITAL_COMMUNITY): Admission: RE | Admit: 2023-10-10 | Payer: Medicare PPO | Source: Ambulatory Visit | Admitting: Orthopedic Surgery

## 2023-10-10 SURGERY — ARTHROPLASTY, SHOULDER, TOTAL, REVERSE
Anesthesia: Choice | Site: Shoulder | Laterality: Left

## 2023-10-14 DIAGNOSIS — M75122 Complete rotator cuff tear or rupture of left shoulder, not specified as traumatic: Secondary | ICD-10-CM | POA: Diagnosis not present

## 2023-10-14 DIAGNOSIS — M25512 Pain in left shoulder: Secondary | ICD-10-CM | POA: Diagnosis not present

## 2023-10-17 DIAGNOSIS — M75122 Complete rotator cuff tear or rupture of left shoulder, not specified as traumatic: Secondary | ICD-10-CM | POA: Diagnosis not present

## 2023-10-17 DIAGNOSIS — M25512 Pain in left shoulder: Secondary | ICD-10-CM | POA: Diagnosis not present

## 2023-10-20 DIAGNOSIS — M25512 Pain in left shoulder: Secondary | ICD-10-CM | POA: Diagnosis not present

## 2023-10-20 DIAGNOSIS — M75122 Complete rotator cuff tear or rupture of left shoulder, not specified as traumatic: Secondary | ICD-10-CM | POA: Diagnosis not present

## 2023-10-23 DIAGNOSIS — M25512 Pain in left shoulder: Secondary | ICD-10-CM | POA: Diagnosis not present

## 2023-10-23 DIAGNOSIS — M75122 Complete rotator cuff tear or rupture of left shoulder, not specified as traumatic: Secondary | ICD-10-CM | POA: Diagnosis not present

## 2023-10-25 DIAGNOSIS — M75122 Complete rotator cuff tear or rupture of left shoulder, not specified as traumatic: Secondary | ICD-10-CM | POA: Diagnosis not present

## 2023-10-25 DIAGNOSIS — M25512 Pain in left shoulder: Secondary | ICD-10-CM | POA: Diagnosis not present

## 2023-10-27 DIAGNOSIS — L820 Inflamed seborrheic keratosis: Secondary | ICD-10-CM | POA: Diagnosis not present

## 2023-10-31 DIAGNOSIS — M25512 Pain in left shoulder: Secondary | ICD-10-CM | POA: Diagnosis not present

## 2023-10-31 DIAGNOSIS — M75122 Complete rotator cuff tear or rupture of left shoulder, not specified as traumatic: Secondary | ICD-10-CM | POA: Diagnosis not present

## 2023-11-04 DIAGNOSIS — M25512 Pain in left shoulder: Secondary | ICD-10-CM | POA: Diagnosis not present

## 2023-11-04 DIAGNOSIS — M75122 Complete rotator cuff tear or rupture of left shoulder, not specified as traumatic: Secondary | ICD-10-CM | POA: Diagnosis not present

## 2023-11-06 DIAGNOSIS — H4312 Vitreous hemorrhage, left eye: Secondary | ICD-10-CM | POA: Diagnosis not present

## 2023-11-06 DIAGNOSIS — H43812 Vitreous degeneration, left eye: Secondary | ICD-10-CM | POA: Diagnosis not present

## 2023-11-06 DIAGNOSIS — T66XXXS Radiation sickness, unspecified, sequela: Secondary | ICD-10-CM | POA: Diagnosis not present

## 2023-11-06 DIAGNOSIS — C6932 Malignant neoplasm of left choroid: Secondary | ICD-10-CM | POA: Diagnosis not present

## 2023-11-06 DIAGNOSIS — H25813 Combined forms of age-related cataract, bilateral: Secondary | ICD-10-CM | POA: Diagnosis not present

## 2023-11-06 DIAGNOSIS — H3589 Other specified retinal disorders: Secondary | ICD-10-CM | POA: Diagnosis not present

## 2023-11-10 DIAGNOSIS — Z1283 Encounter for screening for malignant neoplasm of skin: Secondary | ICD-10-CM | POA: Diagnosis not present

## 2023-11-10 DIAGNOSIS — Z08 Encounter for follow-up examination after completed treatment for malignant neoplasm: Secondary | ICD-10-CM | POA: Diagnosis not present

## 2023-11-10 DIAGNOSIS — Z85820 Personal history of malignant melanoma of skin: Secondary | ICD-10-CM | POA: Diagnosis not present

## 2023-11-10 DIAGNOSIS — L218 Other seborrheic dermatitis: Secondary | ICD-10-CM | POA: Diagnosis not present

## 2023-11-11 DIAGNOSIS — M25512 Pain in left shoulder: Secondary | ICD-10-CM | POA: Diagnosis not present

## 2023-11-11 DIAGNOSIS — M75122 Complete rotator cuff tear or rupture of left shoulder, not specified as traumatic: Secondary | ICD-10-CM | POA: Diagnosis not present

## 2023-11-13 DIAGNOSIS — M75122 Complete rotator cuff tear or rupture of left shoulder, not specified as traumatic: Secondary | ICD-10-CM | POA: Diagnosis not present

## 2023-11-13 DIAGNOSIS — M25512 Pain in left shoulder: Secondary | ICD-10-CM | POA: Diagnosis not present

## 2023-11-17 ENCOUNTER — Telehealth: Payer: Self-pay | Admitting: *Deleted

## 2023-11-17 NOTE — Telephone Encounter (Signed)
   Pre-operative Risk Assessment    Patient Name: Olivia Espinoza  DOB: Sep 08, 1941 MRN: [id number]  DATE OF LAST VISIT: 08/26/23 Carlos Levering, NP DATE OF NEXT VISIT: NONE    Request for Surgical Clearance    Procedure:   LEFT REVERSE TOTAL SHOULDER  Date of Surgery:  Clearance TBD                                 Surgeon:  DR. Malon Kindle Surgeon's Group or Practice Name:  [facility] Phone number:  [phone] KERRI MAZE Fax number:  [phone]   Type of Clearance Requested:   - Medical  - Pharmacy:  Hold Aspirin     Type of Anesthesia:   CHOICE   Additional requests/questions:    Elpidio Anis   11/17/2023, 6:03 PM

## 2023-11-18 ENCOUNTER — Telehealth: Payer: Self-pay | Admitting: *Deleted

## 2023-11-18 DIAGNOSIS — M75122 Complete rotator cuff tear or rupture of left shoulder, not specified as traumatic: Secondary | ICD-10-CM | POA: Diagnosis not present

## 2023-11-18 DIAGNOSIS — M25512 Pain in left shoulder: Secondary | ICD-10-CM | POA: Diagnosis not present

## 2023-11-18 NOTE — Telephone Encounter (Signed)
Pt has been scheduled tele pre op appt. Pt states she is not having surgery until late Jan 2025 or may be very early Feb 2025 but pt wants to plan for late Jan 2025. Med rec and consent are done.

## 2023-11-18 NOTE — Telephone Encounter (Signed)
   Name: Olivia Espinoza  DOB: [birth year]  MRN: [id number]  Primary Cardiologist: Thurmon Fair, MD   Preoperative team, please contact this patient and set up a phone call appointment for further preoperative risk assessment. Please obtain consent and complete medication review. Thank you for your help.  I confirm that guidance regarding antiplatelet and oral anticoagulation therapy has been completed and, if necessary, noted below.  Regarding ASA therapy, we recommend continuation of ASA throughout the perioperative period.  However, if the surgeon feels that cessation of ASA is required in the perioperative period, it may be stopped 5-7 days prior to surgery with a plan to resume it as soon as felt to be feasible from a surgical standpoint in the post-operative period.   I also confirmed the patient resides in the state of [place]. As per [facility] Medical Board telemedicine laws, the patient must reside in the state in which the provider is licensed.   Napoleon Form, Leodis Rains, NP 11/18/2023, 7:30 AM [facility]

## 2023-11-18 NOTE — Telephone Encounter (Signed)
Pt has been scheduled tele pre op appt. Pt states she is not having surgery until late Jan 2025 or may be very early Feb 2025 but pt wants to plan for late Jan 2025. Med rec and consent are done.     Patient Consent for Virtual Visit        [name] has provided verbal consent on 11/18/2023 for a virtual visit (video or telephone).   CONSENT FOR VIRTUAL VISIT FOR:  Olivia Espinoza  By participating in this virtual visit I agree to the following:  I hereby voluntarily request, consent and authorize [facility] and its employed or contracted physicians, physician assistants, nurse practitioners or other licensed health care professionals (the Practitioner), to provide me with telemedicine health care services (the "Services") as deemed necessary by the treating Practitioner. I acknowledge and consent to receive the Services by the Practitioner via telemedicine. I understand that the telemedicine visit will involve communicating with the Practitioner through live audiovisual communication technology and the disclosure of certain medical information by electronic transmission. I acknowledge that I have been given the opportunity to request an in-person assessment or other available alternative prior to the telemedicine visit and am voluntarily participating in the telemedicine visit.  I understand that I have the right to withhold or withdraw my consent to the use of telemedicine in the course of my care at any time, without affecting my right to future care or treatment, and that the Practitioner or I may terminate the telemedicine visit at any time. I understand that I have the right to inspect all information obtained and/or recorded in the course of the telemedicine visit and may receive copies of available information for a reasonable fee.  I understand that some of the potential risks of receiving the Services via telemedicine include:  Delay or interruption in medical  evaluation due to technological equipment failure or disruption; Information transmitted may not be sufficient (e.g. poor resolution of images) to allow for appropriate medical decision making by the Practitioner; and/or  In rare instances, security protocols could fail, causing a breach of personal health information.  Furthermore, I acknowledge that it is my responsibility to provide information about my medical history, conditions and care that is complete and accurate to the best of my ability. I acknowledge that Practitioner's advice, recommendations, and/or decision may be based on factors not within their control, such as incomplete or inaccurate data provided by me or distortions of diagnostic images or specimens that may result from electronic transmissions. I understand that the practice of medicine is not an exact science and that Practitioner makes no warranties or guarantees regarding treatment outcomes. I acknowledge that a copy of this consent can be made available to me via my patient portal [facility] MyChart), or I can request a printed copy by calling the office of [facility].    I understand that my insurance will be billed for this visit.   I have read or had this consent read to me. I understand the contents of this consent, which adequately explains the benefits and risks of the Services being provided via telemedicine.  I have been provided ample opportunity to ask questions regarding this consent and the Services and have had my questions answered to my satisfaction. I give my informed consent for the services to be provided through the use of telemedicine in my medical care

## 2023-11-20 DIAGNOSIS — M75122 Complete rotator cuff tear or rupture of left shoulder, not specified as traumatic: Secondary | ICD-10-CM | POA: Diagnosis not present

## 2023-11-20 DIAGNOSIS — M25512 Pain in left shoulder: Secondary | ICD-10-CM | POA: Diagnosis not present

## 2023-11-24 ENCOUNTER — Ambulatory Visit: Payer: Medicare PPO | Admitting: Podiatry

## 2023-11-24 ENCOUNTER — Encounter: Payer: Self-pay | Admitting: Podiatry

## 2023-11-24 DIAGNOSIS — M79675 Pain in left toe(s): Secondary | ICD-10-CM

## 2023-11-24 DIAGNOSIS — M79674 Pain in right toe(s): Secondary | ICD-10-CM

## 2023-11-24 DIAGNOSIS — B351 Tinea unguium: Secondary | ICD-10-CM | POA: Diagnosis not present

## 2023-11-24 NOTE — Progress Notes (Signed)
This patient presents to the office with chief complaint of long thick painful big nails.  Patient says the nails are painful walking and wearing shoes.  This patient is unable to self treat.  This patient is unable to trim her nails since she is unable to reach her nails. And she had shoulder injury. She presents to the office for preventative foot care services.  General Appearance  Alert, conversant and in no acute stress.  Vascular  Dorsalis pedis and posterior tibial  pulses are palpable  bilaterally.  Capillary return is within normal limits  bilaterally. Temperature is within normal limits  bilaterally.  Neurologic  Senn-Weinstein monofilament wire test within normal limits  bilaterally. Muscle power within normal limits bilaterally.  Nails Thick disfigured discolored nails with subungual debris  hallux nails  toes bilaterally. No evidence of bacterial infection or drainage bilaterally.  Orthopedic  No limitations of motion  feet .  No crepitus or effusions noted.  No bony pathology or digital deformities noted.  Skin  normotropic skin with no porokeratosis noted bilaterally.  No signs of infections or ulcers noted.     Onychomycosis  Nails  B/L.  Pain in right toes  Pain in left toes  Debridement of nails both feet followed trimming the nails with dremel tool.    RTC 3 months.   Helane Gunther DPM

## 2023-11-25 DIAGNOSIS — M25512 Pain in left shoulder: Secondary | ICD-10-CM | POA: Diagnosis not present

## 2023-11-25 DIAGNOSIS — M75122 Complete rotator cuff tear or rupture of left shoulder, not specified as traumatic: Secondary | ICD-10-CM | POA: Diagnosis not present

## 2023-12-02 DIAGNOSIS — M25512 Pain in left shoulder: Secondary | ICD-10-CM | POA: Diagnosis not present

## 2023-12-02 DIAGNOSIS — M75122 Complete rotator cuff tear or rupture of left shoulder, not specified as traumatic: Secondary | ICD-10-CM | POA: Diagnosis not present

## 2023-12-04 DIAGNOSIS — M75122 Complete rotator cuff tear or rupture of left shoulder, not specified as traumatic: Secondary | ICD-10-CM | POA: Diagnosis not present

## 2023-12-04 DIAGNOSIS — M25512 Pain in left shoulder: Secondary | ICD-10-CM | POA: Diagnosis not present

## 2023-12-15 DIAGNOSIS — M25512 Pain in left shoulder: Secondary | ICD-10-CM | POA: Diagnosis not present

## 2023-12-15 DIAGNOSIS — M75122 Complete rotator cuff tear or rupture of left shoulder, not specified as traumatic: Secondary | ICD-10-CM | POA: Diagnosis not present

## 2023-12-16 DIAGNOSIS — M75122 Complete rotator cuff tear or rupture of left shoulder, not specified as traumatic: Secondary | ICD-10-CM | POA: Diagnosis not present

## 2023-12-16 DIAGNOSIS — M25512 Pain in left shoulder: Secondary | ICD-10-CM | POA: Diagnosis not present

## 2023-12-25 DIAGNOSIS — M75122 Complete rotator cuff tear or rupture of left shoulder, not specified as traumatic: Secondary | ICD-10-CM | POA: Diagnosis not present

## 2023-12-25 DIAGNOSIS — M25512 Pain in left shoulder: Secondary | ICD-10-CM | POA: Diagnosis not present

## 2024-01-09 NOTE — Progress Notes (Addendum)
 Virtual Visit via Telephone Note   Because of [name]'s co-morbid illnesses, she is at least at moderate risk for complications without adequate follow up.  This format is felt to be most appropriate for this patient at this time.  The patient did not have access to video technology/had technical difficulties with video requiring transitioning to audio format only (telephone).  All issues noted in this document were discussed and addressed.  No physical exam could be performed with this format.  Please refer to the patient's chart for her consent to telehealth for [facility].  Evaluation Performed:  Preoperative cardiovascular risk assessment _____________   Date:  01/12/2024   Patient ID:  Olivia Espinoza, DOB [birth year], MRN [id number] Patient Location:  Home Provider location:   Office  Primary Care Provider:  Cyrena Gwenn SQUIBB, MD (Inactive) Primary Cardiologist:  Jerel Balding, MD  Chief Complaint / Patient Profile   83 y.o. y/o female with a h/o nonobstructive CAD, this was per coronary CTA, hypertension, palpitations, hyperlipidemia, and hypothyroidism.   She is pending left reverse total shoulder arthroplasty by Dr. Garnette Her on date to be determined.  He presents today for telephonic preoperative cardiovascular risk assessment.   History of Present Illness    Olivia Espinoza is a 83 y.o. female who presents via audio/video conferencing for a telehealth visit today.  Pt was last seen in cardiology clinic on 08/26/2023 by Barnie Hila, DNP, NP.,  For preoperative evaluation for left reverse total shoulder arthroplasty.  At that time Olivia Espinoza was doing well without any cardiac complaints..  The patient is now pending procedure as outlined above. Since her last visit, she remains active but admits to not exercising as much as she should. She denies chest pain, DOE, or dizziness.   She is medically complaint.   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   Procedure: RESECTION OF MASS RIGHT THORACIC INLET VIA SUPRACLAVICULAR APPROACH;  Surgeon: Elspeth JAYSON Millers, MD;  Location: [facility] OR;  Service: Thoracic;  Laterality: Right;   TUBAL LIGATION      Allergies  Allergies  Allergen Reactions   Chlorhexidine  Gluconate Itching   Levofloxacin Other (See Comments)    Made her feel like someone hit her in the stomach with a bat   Motrin [Ibuprofen] Hives    Can take aspirin    Rosuvastatin Other (See Comments)    Other reaction(s): Myalgias (intolerance)   Ciprofloxacin     Other reaction(s): Patient is not sure she is even allergic to this   Ezetimibe     Other reaction(s): questionable rash but was lisinopril    Ceclor [Cefaclor] Rash    Prefers amoxicillin per pt   Lisinopril  Rash    Home Medications    Prior to Admission medications   Medication Sig Start Date End Date Taking? Authorizing Provider  amLODipine (NORVASC) 5 MG tablet Take 5 mg by mouth daily in the afternoon. 07/27/21   [provider]  aspirin  EC 81 MG tablet Take 81 mg by mouth every 6 (six) hours as needed for moderate pain.  [provider]  clobetasol cream (TEMOVATE) 0.05 % Apply 1 Application topically daily as needed (irritation). 09/04/23   [provider]  levothyroxine  (SYNTHROID ) 75 MCG tablet Take 75 mcg by mouth daily before breakfast. 07/01/14   [provider]  Multiple Vitamin (MULTIVITAMIN WITH MINERALS) TABS tablet Take 1 tablet by mouth daily.    [provider]  Povidone, PF, (IVIZIA DRY EYES) 0.5 % SOLN Place 1 drop into both eyes as needed (dry eyes).    [provider]   REPATHA  SURECLICK 140 MG/ML SOAJ ADMINISTER 1 ML(140MG ) UNDER THE SKIN EVERY 14 DAYS 02/10/23   Croitoru, Mihai, MD  triamcinolone (NASACORT ALLERGY 24HR) 55 MCG/ACT AERO nasal inhaler Place 2 sprays into the nose daily as needed (allergies).    [provider]    Physical Exam    Vital Signs:  Olivia Espinoza does not have vital signs available for review today.  Given telephonic nature of communication, physical exam is limited. AAOx3. NAD. Normal affect.  Speech and respirations are unlabored.  Accessory Clinical Findings    None  Assessment & Plan    1.  Preoperative Cardiovascular Risk Assessment:  According to the Revised Cardiac Risk Index (RCRI), her Perioperative Risk of Major Cardiac Event is (%): 0.9  Her Functional Capacity in METs is: 8.97 according to the Duke Activity Status Index (DASI).   The patient was advised that if she develops new symptoms prior to surgery to contact our office to arrange for a follow-up visit, and she verbalized understanding.  Per office protocol, if patient is without any new symptoms or concerns at the time of their virtual visit, he/she may hold ASA for 7 days prior to procedure. Please resume ASA as soon as possible postprocedure, at the discretion of the surgeon.    A copy of this note will be routed to requesting surgeon.  Time:   Today, I have spent 20 minutes with the patient with telehealth technology discussing medical history, symptoms, and management plan.     Lamarr Satterfield, NP  01/12/2024, 10:05 AM

## 2024-01-12 ENCOUNTER — Telehealth: Payer: Self-pay | Admitting: Adult Health

## 2024-01-12 ENCOUNTER — Ambulatory Visit: Payer: Medicare PPO | Attending: Cardiology

## 2024-01-12 ENCOUNTER — Telehealth (HOSPITAL_BASED_OUTPATIENT_CLINIC_OR_DEPARTMENT_OTHER): Payer: Self-pay

## 2024-01-12 DIAGNOSIS — Z01810 Encounter for preprocedural cardiovascular examination: Secondary | ICD-10-CM | POA: Diagnosis not present

## 2024-01-12 DIAGNOSIS — Z01818 Encounter for other preprocedural examination: Secondary | ICD-10-CM

## 2024-01-12 MED ORDER — REPATHA SURECLICK 140 MG/ML ~~LOC~~ SOAJ: 140.0000 mg | SUBCUTANEOUS | 6 refills | Status: DC

## 2024-01-12 NOTE — Telephone Encounter (Deleted)
-----   Message from Lamarr Satterfield sent at 01/12/2024 10:15 AM EST ----- Regarding: Repatha  Refill Would please send in refills on the Repatha .She has appt for one year follow up in April with Dr. JAYSON.  So please give her refills through April.  Also, she gets drug assistance for this med through the Pharmacy.  Could you give them a heads up to request renewal of this for her to help financially?  Thanks, Lamarr

## 2024-01-12 NOTE — Telephone Encounter (Signed)
 Patient requested refills on Repatha  while I spoke with her about pre-operative surgical clearance, and also to make appointment for annual follow up with Dr. Francyne.  I sent a message to Corean Bare CMA and also requested renewal of patient assistance through pharmacy. She was also scheduled appointment with Dr. Francyne on 04/06/2024 at 10 am.  Lamarr Satterfield DNP, ANP.SABRA

## 2024-01-12 NOTE — Telephone Encounter (Addendum)
 Called patient regarding medication refill . Unable to leave message.----- Message from Lamarr Satterfield sent at 01/12/2024 10:15 AM EST ----- Regarding: Repatha  Refill Would please send in refills on the Repatha .She has appt for one year follow up in April with Dr. JAYSON.  So please give her refills through April.  Also, she gets drug assistance for this med through the Pharmacy.  Could you give them a heads up to request renewal of this for her to help financially?  Thanks, Lamarr

## 2024-01-13 DIAGNOSIS — M25512 Pain in left shoulder: Secondary | ICD-10-CM | POA: Diagnosis not present

## 2024-01-13 DIAGNOSIS — I10 Essential (primary) hypertension: Secondary | ICD-10-CM | POA: Diagnosis not present

## 2024-01-13 DIAGNOSIS — Z01818 Encounter for other preprocedural examination: Secondary | ICD-10-CM | POA: Diagnosis not present

## 2024-01-13 DIAGNOSIS — N1831 Chronic kidney disease, stage 3a: Secondary | ICD-10-CM | POA: Diagnosis not present

## 2024-01-13 DIAGNOSIS — J0100 Acute maxillary sinusitis, unspecified: Secondary | ICD-10-CM | POA: Diagnosis not present

## 2024-01-15 DIAGNOSIS — M75122 Complete rotator cuff tear or rupture of left shoulder, not specified as traumatic: Secondary | ICD-10-CM | POA: Diagnosis not present

## 2024-01-15 DIAGNOSIS — M25512 Pain in left shoulder: Secondary | ICD-10-CM | POA: Diagnosis not present

## 2024-01-25 DIAGNOSIS — M75122 Complete rotator cuff tear or rupture of left shoulder, not specified as traumatic: Secondary | ICD-10-CM | POA: Diagnosis not present

## 2024-01-25 DIAGNOSIS — M25512 Pain in left shoulder: Secondary | ICD-10-CM | POA: Diagnosis not present

## 2024-02-02 DIAGNOSIS — L820 Inflamed seborrheic keratosis: Secondary | ICD-10-CM | POA: Diagnosis not present

## 2024-02-05 DIAGNOSIS — I700 Atherosclerosis of aorta: Secondary | ICD-10-CM | POA: Diagnosis not present

## 2024-02-05 DIAGNOSIS — C6932 Malignant neoplasm of left choroid: Secondary | ICD-10-CM | POA: Diagnosis not present

## 2024-02-05 DIAGNOSIS — H3589 Other specified retinal disorders: Secondary | ICD-10-CM | POA: Diagnosis not present

## 2024-02-05 DIAGNOSIS — H43812 Vitreous degeneration, left eye: Secondary | ICD-10-CM | POA: Diagnosis not present

## 2024-02-05 DIAGNOSIS — F5101 Primary insomnia: Secondary | ICD-10-CM | POA: Diagnosis not present

## 2024-02-05 DIAGNOSIS — N1831 Chronic kidney disease, stage 3a: Secondary | ICD-10-CM | POA: Diagnosis not present

## 2024-02-05 DIAGNOSIS — E039 Hypothyroidism, unspecified: Secondary | ICD-10-CM | POA: Diagnosis not present

## 2024-02-05 DIAGNOSIS — H25813 Combined forms of age-related cataract, bilateral: Secondary | ICD-10-CM | POA: Diagnosis not present

## 2024-02-05 DIAGNOSIS — I10 Essential (primary) hypertension: Secondary | ICD-10-CM | POA: Diagnosis not present

## 2024-02-05 DIAGNOSIS — H4312 Vitreous hemorrhage, left eye: Secondary | ICD-10-CM | POA: Diagnosis not present

## 2024-02-05 DIAGNOSIS — E785 Hyperlipidemia, unspecified: Secondary | ICD-10-CM | POA: Diagnosis not present

## 2024-02-05 DIAGNOSIS — T66XXXS Radiation sickness, unspecified, sequela: Secondary | ICD-10-CM | POA: Diagnosis not present

## 2024-02-05 DIAGNOSIS — Z0000 Encounter for general adult medical examination without abnormal findings: Secondary | ICD-10-CM | POA: Diagnosis not present

## 2024-02-12 ENCOUNTER — Other Ambulatory Visit: Payer: Self-pay | Admitting: Cardiovascular Disease

## 2024-02-12 DIAGNOSIS — I2510 Atherosclerotic heart disease of native coronary artery without angina pectoris: Secondary | ICD-10-CM

## 2024-02-12 DIAGNOSIS — E7800 Pure hypercholesterolemia, unspecified: Secondary | ICD-10-CM

## 2024-02-12 DIAGNOSIS — I700 Atherosclerosis of aorta: Secondary | ICD-10-CM

## 2024-02-15 DIAGNOSIS — M75122 Complete rotator cuff tear or rupture of left shoulder, not specified as traumatic: Secondary | ICD-10-CM | POA: Diagnosis not present

## 2024-02-15 DIAGNOSIS — M25512 Pain in left shoulder: Secondary | ICD-10-CM | POA: Diagnosis not present

## 2024-02-25 DIAGNOSIS — M25512 Pain in left shoulder: Secondary | ICD-10-CM | POA: Diagnosis not present

## 2024-02-25 DIAGNOSIS — M75122 Complete rotator cuff tear or rupture of left shoulder, not specified as traumatic: Secondary | ICD-10-CM | POA: Diagnosis not present

## 2024-03-03 ENCOUNTER — Encounter: Payer: Self-pay | Admitting: Podiatry

## 2024-03-03 ENCOUNTER — Ambulatory Visit: Payer: Medicare PPO | Admitting: Podiatry

## 2024-03-03 DIAGNOSIS — Z1231 Encounter for screening mammogram for malignant neoplasm of breast: Secondary | ICD-10-CM | POA: Diagnosis not present

## 2024-03-03 DIAGNOSIS — M79675 Pain in left toe(s): Secondary | ICD-10-CM | POA: Diagnosis not present

## 2024-03-03 DIAGNOSIS — B351 Tinea unguium: Secondary | ICD-10-CM | POA: Diagnosis not present

## 2024-03-03 DIAGNOSIS — M79674 Pain in right toe(s): Secondary | ICD-10-CM | POA: Diagnosis not present

## 2024-03-03 DIAGNOSIS — M8588 Other specified disorders of bone density and structure, other site: Secondary | ICD-10-CM | POA: Diagnosis not present

## 2024-03-03 NOTE — Progress Notes (Signed)
 This patient presents to the office with chief complaint of long thick painful big nails.  Patient says the nails are painful walking and wearing shoes.  This patient is unable to self treat.  This patient is unable to trim her nails since she is unable to reach her nails. And she had shoulder injury. She presents to the office for preventative foot care services.  General Appearance  Alert, conversant and in no acute stress.  Vascular  Dorsalis pedis and posterior tibial  pulses are palpable  bilaterally.  Capillary return is within normal limits  bilaterally. Temperature is within normal limits  bilaterally.  Neurologic  Senn-Weinstein monofilament wire test within normal limits  bilaterally. Muscle power within normal limits bilaterally.  Nails Thick disfigured discolored nails with subungual debris  hallux nails  toes bilaterally. No evidence of bacterial infection or drainage bilaterally.  Orthopedic  No limitations of motion  feet .  No crepitus or effusions noted.  No bony pathology or digital deformities noted.  Skin  normotropic skin with no porokeratosis noted bilaterally.  No signs of infections or ulcers noted.     Onychomycosis  Nails  B/L.  Pain in right toes  Pain in left toes  Debridement of nails both feet followed trimming the nails with dremel tool.    RTC 10 weeks    Helane Gunther DPM

## 2024-03-04 NOTE — H&P (Signed)
 Patient's anticipated LOS is less than 2 midnights, meeting these requirements: - Younger than 61 - Lives within 1 hour of care - Has a competent adult at home to recover with post-op recover - NO history of  - Chronic pain requiring opiods  - Diabetes  - Coronary Artery Disease  - Heart failure  - Heart attack  - Stroke  - DVT/VTE  - Cardiac arrhythmia  - Respiratory Failure/COPD  - Renal failure  - Anemia  - Advanced Liver disease     Olivia Espinoza is an 83 y.o. female.    Chief Complaint: left shoulder pain  HPI: Pt is a 83 y.o. female complaining of left shoulder pain for multiple years. Pain had continually increased since the beginning. X-rays in the clinic show end-stage arthritic changes of the left shoulder. Pt has tried various conservative treatments which have failed to alleviate their symptoms, including injections and therapy. Various options are discussed with the patient. Risks, benefits and expectations were discussed with the patient. Patient understand the risks, benefits and expectations and wishes to proceed with surgery.   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 She has never used smokeless tobacco. She reports current alcohol use. She reports that she does not use drugs. BMI: Estimated body mass index is 23.81 kg/m as calculated from the  following:   Height as of 09/29/23: 5\' 1"  (1.549 m).   Weight as of 09/29/23: 57.2 kg.  Lab Results  Component Value Date   ALBUMIN 4.1 05/06/2022   Diabetes: Patient does not have a diagnosis of diabetes.     Smoking Status:   reports that she has never smoked. She has never used smokeless tobacco.    Allergies:  Allergies  Allergen Reactions   Chlorhexidine Gluconate Itching   Levofloxacin Other (See Comments)    Made her feel like someone hit her in the stomach with a bat   Motrin [Ibuprofen] Hives    Can take aspirin   Rosuvastatin Other (See Comments)    Other reaction(s): Myalgias (intolerance)   Ciprofloxacin     Other reaction(s): Patient is not sure she is even allergic to this   Ezetimibe     Other reaction(s): questionable rash but was lisinopril   Ceclor [Cefaclor] Rash    Prefers amoxicillin per pt   Lisinopril Rash    Medications: No current facility-administered medications for this encounter.   Current Outpatient Medications  Medication Sig Dispense Refill   amLODipine (NORVASC) 5 MG tablet Take 5 mg by mouth daily in the afternoon.     aspirin EC 81 MG tablet Take 81 mg by mouth every 6 (six) hours as needed for moderate pain.     clobetasol cream (TEMOVATE) 0.05 % Apply 1 Application topically daily as needed (  irritation).     Evolocumab (REPATHA SURECLICK) 140 MG/ML SOAJ Inject 140 mg into the skin every 14 (fourteen) days. 6 mL 1   levothyroxine (SYNTHROID) 75 MCG tablet Take 75 mcg by mouth daily before breakfast.     Multiple Vitamin (MULTIVITAMIN WITH MINERALS) TABS tablet Take 1 tablet by mouth daily.     Povidone, PF, (IVIZIA DRY EYES) 0.5 % SOLN Place 1 drop into both eyes as needed (dry eyes).     triamcinolone (NASACORT ALLERGY 24HR) 55 MCG/ACT AERO nasal inhaler Place 2 sprays into the nose daily as needed (allergies).      No results found for this or any previous visit (from the past 48 hours). No results found.  ROS: Pain with rom of  the left upper extremity  Physical Exam: Alert and oriented 83 y.o. female in no acute distress Cranial nerves 2-12 intact Cervical spine: full rom with no tenderness, nv intact distally Chest: active breath sounds bilaterally, no wheeze rhonchi or rales Heart: regular rate and rhythm, no murmur Abd: non tender non distended with active bowel sounds Hip is stable with rom  Left shoulder painful and weak rom Nv intact distally No rashes or edema distally  Assessment/Plan Assessment: left shoulder cuff arthropathy  Plan:  Patient will undergo a left reverse total shoulder by Dr. Ranell Patrick at [place] Risks benefits and expectations were discussed with the patient. Patient understand risks, benefits and expectations and wishes to proceed. Preoperative templating of the joint replacement has been completed, documented, and submitted to the Operating Room personnel in order to optimize intra-operative equipment management.   Alphonsa Overall PA-C, MPAS [facility] is now [employer] [address], [address], [place], [place] [address] Phone: [phone] [email or web address] Facebook  [employer]

## 2024-03-16 DIAGNOSIS — M75122 Complete rotator cuff tear or rupture of left shoulder, not specified as traumatic: Secondary | ICD-10-CM | POA: Diagnosis not present

## 2024-03-16 DIAGNOSIS — M25512 Pain in left shoulder: Secondary | ICD-10-CM | POA: Diagnosis not present

## 2024-03-18 ENCOUNTER — Encounter (HOSPITAL_COMMUNITY): Admission: RE | Admit: 2024-03-18 | Payer: Medicare PPO | Source: Ambulatory Visit

## 2024-03-26 ENCOUNTER — Ambulatory Visit (HOSPITAL_COMMUNITY): Admit: 2024-03-26 | Payer: Medicare PPO | Admitting: Orthopedic Surgery

## 2024-03-26 DIAGNOSIS — M75122 Complete rotator cuff tear or rupture of left shoulder, not specified as traumatic: Secondary | ICD-10-CM | POA: Diagnosis not present

## 2024-03-26 DIAGNOSIS — M25512 Pain in left shoulder: Secondary | ICD-10-CM | POA: Diagnosis not present

## 2024-03-26 SURGERY — ARTHROPLASTY, SHOULDER, TOTAL, REVERSE
Anesthesia: Choice | Site: Shoulder | Laterality: Left

## 2024-04-07 ENCOUNTER — Ambulatory Visit: Payer: Medicare PPO | Attending: Cardiovascular Disease | Admitting: Cardiovascular Disease

## 2024-04-07 ENCOUNTER — Encounter: Payer: Self-pay | Admitting: Cardiovascular Disease

## 2024-04-07 VITALS — BP 120/74 | HR 68 | Ht 61.5 in | Wt 123.0 lb

## 2024-04-07 DIAGNOSIS — T466X5D Adverse effect of antihyperlipidemic and antiarteriosclerotic drugs, subsequent encounter: Secondary | ICD-10-CM | POA: Diagnosis not present

## 2024-04-07 DIAGNOSIS — I10 Essential (primary) hypertension: Secondary | ICD-10-CM | POA: Diagnosis not present

## 2024-04-07 DIAGNOSIS — I700 Atherosclerosis of aorta: Secondary | ICD-10-CM

## 2024-04-07 DIAGNOSIS — E7800 Pure hypercholesterolemia, unspecified: Secondary | ICD-10-CM | POA: Diagnosis not present

## 2024-04-07 DIAGNOSIS — I2510 Atherosclerotic heart disease of native coronary artery without angina pectoris: Secondary | ICD-10-CM | POA: Diagnosis not present

## 2024-04-07 DIAGNOSIS — G720 Drug-induced myopathy: Secondary | ICD-10-CM

## 2024-04-07 DIAGNOSIS — Z01810 Encounter for preprocedural cardiovascular examination: Secondary | ICD-10-CM | POA: Diagnosis not present

## 2024-04-07 NOTE — Patient Instructions (Signed)
 Medication Instructions:  No changes *If you need a refill on your cardiac medications before your next appointment, please call your pharmacy*  Follow-Up: At [facility], you and your health needs are our priority.  As part of our continuing mission to provide you with exceptional heart care, our providers are all part of one team.  This team includes your primary Cardiologist (physician) and Advanced Practice Providers or APPs (Physician Assistants and Nurse Practitioners) who all work together to provide you with the care you need, when you need it.  Your next appointment:   1 year(s)  Provider:   Thurmon Fair, MD     We recommend signing up for the patient portal called "MyChart".  Sign up information is provided on this After Visit Summary.  MyChart is used to connect with patients for Virtual Visits (Telemedicine).  Patients are able to view lab/test results, encounter notes, upcoming appointments, etc.  Non-urgent messages can be sent to your provider as well.   To learn more about what you can do with MyChart, go to [email or web address].        1st Floor: - Lobby - Registration  - Pharmacy  - Lab - Cafe  2nd Floor: - PV Lab - Diagnostic Testing (echo, CT, nuclear med)  3rd Floor: - Vacant  4th Floor: - TCTS (cardiothoracic surgery) - AFib Clinic - Structural Heart Clinic - Vascular Surgery  - Vascular Ultrasound  5th Floor: - HeartCare Cardiology (general and EP) - Clinical Pharmacy for coumadin, hypertension, lipid, weight-loss medications, and med management appointments    Valet parking services will be available as well.

## 2024-04-07 NOTE — Progress Notes (Signed)
 Cardiology Office Note:    Date:  04/07/2024   ID:  Olivia Espinoza, DOB 1941/08/17, MRN [id number]  PCP:  Jackelyn Poling, DO Jackelyn Poling, MD   [facility] Providers Cardiologist:  Thurmon Fair, MD     Referring MD: No ref. provider found   No chief complaint on file.    History of Present Illness:    Olivia Espinoza is a 83 y.o. female with a hx of hypercholesterolemia, hypertension, treated hypothyroidism and a strong family history of coronary disease, who has "CAD-equivalent" elevated coronary calcium score at 541 (85th percentile).  Coronary CT angiography 01/04/2022 did not show any meaningful coronary stenoses.  She is doing great from a cardiovascular point of view and remains very physically active.  Unfortunately last summer she had a mechanical fall and dislocated her left shoulder.  She has been planning left rotator cuff surgery.  This was initially delayed since she needed time for nerve healing, but she is thinking of having it done in July.  Despite the injury she continues to enjoy gardening and walking and is very much an "outside girl".  Occasionally she has mild ankle swelling towards the end of the day that resolves after she keeps her legs elevated overnight.  She is on amlodipine and has some visible varicose veins.  The patient specifically denies any chest pain at rest or with exertion, dyspnea at rest or with exertion, orthopnea, paroxysmal nocturnal dyspnea, syncope, palpitations, focal neurological deficits, intermittent claudication, unexplained weight gain, cough, hemoptysis or wheezing.   Problems with a rash on lisinopril and fatigue on valsartan and her blood pressure has been controlled with amlodipine, which may be contributing to her occasional mild lower extremity swelling.  She developed a rash with lisinopril.    She has a history of statin myopathy (rosuvastatin and simvastatin) and also did not tolerate ezetimibe.  She has a very high calcium  score but no significant coronary stenoses on CT angiography.  She has an excellent metabolic profile on Repatha: HDL 73, LDL 66, normal triglycerides and glucose levels.  Her father had a myocardial infarction at age 71, her brother had bypass surgery at age 60.  Another brother who was always very healthy, a triathlete and very lean, ended up having bypass surgery at age 87.  She is upset that she has gained a few pounds, but remains her optimal weight with a BMI of 23.  She is very physically active.   She had a normal nuclear stress test in 2018.  This was performed because a chest CT described aortic atherosclerosis and coronary artery calcifications.  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 Dorris Fetch in 2017.  Untreated lipid profile showed total cholesterol 232 and an LDL cholesterol of 149.  Her HDL is always good in the high 50s-60 range.  She has normal triglycerides.  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   aspirin EC 81 MG tablet Take 81 mg by mouth every 6 (six) hours as needed for moderate pain.   clobetasol cream (TEMOVATE) 0.05 % Apply 1  Application topically daily as needed (irritation).   Evolocumab (REPATHA SURECLICK) 140 MG/ML SOAJ Inject 140 mg into the skin every 14 (fourteen) days.   levothyroxine (SYNTHROID) 75 MCG tablet Take 75 mcg by mouth daily before breakfast.   Multiple Vitamin (MULTIVITAMIN WITH MINERALS) TABS tablet Take 1 tablet by mouth daily.   Povidone, PF, (IVIZIA DRY EYES) 0.5 % SOLN Place 1 drop into both eyes as needed (dry eyes).   triamcinolone (NASACORT ALLERGY 24HR) 55 MCG/ACT AERO nasal inhaler Place 2 sprays into the nose daily as needed (allergies).     Allergies:   Chlorhexidine gluconate, Levofloxacin, Motrin [ibuprofen], Rosuvastatin, Ciprofloxacin, Ezetimibe, Ceclor [cefaclor], and Lisinopril  Family History: The patient's family history includes CVA in her father; Heart attack in her father.  Her father's heart attack occurred at age 11.  2 of her brothers had bypass surgery, 1 at age 6, the other at age 72  ROS:   Please see the history of present illness.     All other systems reviewed and are negative.  EKGs/Labs/Other Studies Reviewed:     EKG Interpretation Date/Time:  Wednesday April 07 2024 10:29:58 EDT Ventricular Rate:  68 PR Interval:  164 QRS Duration:  66 QT Interval:  420 QTC Calculation: 446 R Axis:   5  Text Interpretation: Normal sinus rhythm When compared with ECG of 21-Mar-2016 11:39, QRS axis Shifted right Questionable change in initial forces of Septal leads Confirmed by Tavien Chestnut [phone]) on 04/07/2024 10:51:51 AM         The following studies were reviewed today: Lexiscan Myoview 01/10/2017  Nuclear stress EF: 78%. The study is normal. This is a low risk study. The left ventricular ejection fraction is hyperdynamic (>65%). There was no ST segment deviation noted during stress.   Extracardiac activity obscures the mid and basal inferior wall No other infarct/ischemia EF normal 78%  Low risk study   CT chest June 09, 2018 1. Postoperative  changes in the right supraclavicular region appear similar to prior examinations related to resected schwannoma. No definite findings to suggest local recurrence. 2. No acute findings in the thorax. 3. Aortic atherosclerosis, in addition to three-vessel coronary artery disease. Assessment for potential risk factor modification, dietary therapy or pharmacologic therapy may be warranted, if clinically indicated.   Coronary CTA 01/03/2022 1. Coronary artery calcium score 541 Agatston units. This places the patient in the 83rd percentile for age and gender, suggesting high risk for future cardiac events. 2.  Nonobstructive coronary disease.  EKG:  EKG is  ordered today.  Shows normal sinus rhythm with somewhat low voltage throughout, QS pattern in leads V1-V2, QTc 453 ms.  Previous ECG showed sinus rhythm with a single PVC and borderline voltage criteria for LVH with nonspecific repolarization abnormalities (< 1mm ST segment depression in the inferior leads).  Borderline left axis deviation.  QTc 423 ms.   The difference and seem to be related to lead placement.  Recent Labs: 09/29/2023: BUN 22; Creatinine,  Ser 0.92; Hemoglobin 13.7; Platelets 351; Potassium 3.8; Sodium 137  01/23/2022 Creatinine 1.02, potassium 4.7, ALT 19  01/13/2024  hemoglobin 13.7, potassium 4.2, creatinine 0.92  Recent Lipid Panel    Component Value Date/Time   CHOL 155 05/06/2022 1427   TRIG 108 05/06/2022 1427   HDL 71 05/06/2022 1427   CHOLHDL 2.2 05/06/2022 1427   CHOLHDL 4.1 03/25/2011 1250   VLDL 27 03/25/2011 1250   LDLCALC 65 05/06/2022 1427   12/05/2021 Cholesterol 217, HDL 60, LDL 137, triglycerides 110 Creatinine 0.94, potassium 4.8, ALT 14, TSH 1.77  01/29/2023 Cholesterol 164, HDL 76, LDL 63, triglycerides 144 Creatinine 0.96, potassium 4.8, ALT 17, TSH 0.81  10/09/2023 Cholesterol 152, HDL 73, LDL 66, triglycerides 66  Risk Assessment/Calculations:        Physical Exam:    VS:  BP  120/74   Pulse 68   Ht 5' 1.5" (1.562 m)   Wt 123 lb (55.8 kg)   SpO2 96%   BMI 22.86 kg/m     Wt Readings from Last 3 Encounters:  04/07/24 123 lb (55.8 kg)  09/29/23 126 lb (57.2 kg)  03/24/23 124 lb (56.2 kg)     General: Alert, oriented x3, no distress, lean and fit Head: no evidence of trauma, PERRL, EOMI, no exophtalmos or lid lag, no myxedema, no xanthelasma; normal ears, nose and oropharynx Neck: normal jugular venous pulsations and no hepatojugular reflux; brisk carotid pulses without delay and no carotid bruits Chest: clear to auscultation, no signs of consolidation by percussion or palpation, normal fremitus, symmetrical and full respiratory excursions Cardiovascular: normal position and quality of the apical impulse, regular rhythm, normal first and second heart sounds, no murmurs, rubs or gallops Abdomen: no tenderness or distention, no masses by palpation, no abnormal pulsatility or arterial bruits, normal bowel sounds, no hepatosplenomegaly Extremities: some bilateral varicose veins; no clubbing, cyanosis or edema; 2+ radial, ulnar and brachial pulses bilaterally; 2+ right femoral, posterior tibial and dorsalis pedis pulses; 2+ left femoral, posterior tibial and dorsalis pedis pulses; no subclavian or femoral bruits Neurological: grossly nonfocal Psych: Normal mood and affect     ASSESSMENT:    1. Coronary artery disease involving native coronary artery of native heart without angina pectoris   2. Aortic atherosclerosis (HCC)   3. Essential hypertension   4. Statin myopathy   5. Hypercholesterolemia   6. Preoperative cardiovascular examination      PLAN:    In order of problems listed above:  Coronary and aortic atherosclerosis: Asymptomatic.  Elevated coronary calcium score but no significant obstruction on CT angiography in 2023.  ECG consistently reports a "septal infarct age undetermined" but there is no evidence of previous myocardial infarction on  additional workup. HTN: Well-controlled.  Mild edema may be part related to amlodipine but does not justify changing the medication. HLP: Myopathy with 2 different statins in the past.  Excellent response to Repatha. Preop CV exam: Low risk for major cardiovascular complications with planned shoulder repair surgery.      Medication Adjustments/Labs and Tests Ordered: Current medicines are reviewed at length with the patient today.  Concerns regarding medicines are outlined above.  Orders Placed This Encounter  Procedures   EKG 12-Lead   No orders of the defined types were placed in this encounter.   Patient Instructions  Medication Instructions:  No changes *If you need a refill on your cardiac medications before your next appointment, please call your pharmacy*  Follow-Up: At [facility], you and your  health needs are our priority.  As part of our continuing mission to provide you with exceptional heart care, our providers are all part of one team.  This team includes your primary Cardiologist (physician) and Advanced Practice Providers or APPs (Physician Assistants and Nurse Practitioners) who all work together to provide you with the care you need, when you need it.  Your next appointment:   1 year(s)  Provider:   Thurmon Fair, MD     We recommend signing up for the patient portal called "MyChart".  Sign up information is provided on this After Visit Summary.  MyChart is used to connect with patients for Virtual Visits (Telemedicine).  Patients are able to view lab/test results, encounter notes, upcoming appointments, etc.  Non-urgent messages can be sent to your provider as well.   To learn more about what you can do with MyChart, go to [email or web address].        1st Floor: - Lobby - Registration  - Pharmacy  - Lab - Cafe  2nd Floor: - PV Lab - Diagnostic Testing (echo, CT, nuclear med)  3rd Floor: - Vacant  4th Floor: - TCTS (cardiothoracic  surgery) - AFib Clinic - Structural Heart Clinic - Vascular Surgery  - Vascular Ultrasound  5th Floor: - HeartCare Cardiology (general and EP) - Clinical Pharmacy for coumadin, hypertension, lipid, weight-loss medications, and med management appointments    Valet parking services will be available as well.      Signed, Thurmon Fair, MD  04/07/2024 11:11 AM    [facility]

## 2024-04-16 DIAGNOSIS — M25512 Pain in left shoulder: Secondary | ICD-10-CM | POA: Diagnosis not present

## 2024-04-16 DIAGNOSIS — J0110 Acute frontal sinusitis, unspecified: Secondary | ICD-10-CM | POA: Diagnosis not present

## 2024-04-16 DIAGNOSIS — M75122 Complete rotator cuff tear or rupture of left shoulder, not specified as traumatic: Secondary | ICD-10-CM | POA: Diagnosis not present

## 2024-04-26 DIAGNOSIS — M75122 Complete rotator cuff tear or rupture of left shoulder, not specified as traumatic: Secondary | ICD-10-CM | POA: Diagnosis not present

## 2024-04-26 DIAGNOSIS — M25512 Pain in left shoulder: Secondary | ICD-10-CM | POA: Diagnosis not present

## 2024-05-13 ENCOUNTER — Ambulatory Visit: Admitting: Podiatry

## 2024-05-14 DIAGNOSIS — R0981 Nasal congestion: Secondary | ICD-10-CM | POA: Diagnosis not present

## 2024-05-18 ENCOUNTER — Ambulatory Visit: Admitting: Podiatry

## 2024-05-26 ENCOUNTER — Encounter: Payer: Self-pay | Admitting: Podiatry

## 2024-05-26 ENCOUNTER — Ambulatory Visit: Admitting: Podiatry

## 2024-05-26 DIAGNOSIS — M79675 Pain in left toe(s): Secondary | ICD-10-CM | POA: Diagnosis not present

## 2024-05-26 DIAGNOSIS — B351 Tinea unguium: Secondary | ICD-10-CM | POA: Diagnosis not present

## 2024-05-26 DIAGNOSIS — M75122 Complete rotator cuff tear or rupture of left shoulder, not specified as traumatic: Secondary | ICD-10-CM | POA: Diagnosis not present

## 2024-05-26 DIAGNOSIS — M25512 Pain in left shoulder: Secondary | ICD-10-CM | POA: Diagnosis not present

## 2024-05-26 DIAGNOSIS — M79674 Pain in right toe(s): Secondary | ICD-10-CM

## 2024-05-26 NOTE — Progress Notes (Signed)
 This patient presents to the office with chief complaint of long thick painful big nails.  Patient says the nails are painful walking and wearing shoes.  This patient is unable to self treat.  This patient is unable to trim her nails since she is unable to reach her nails. And she had shoulder injury. She presents to the office for preventative foot care services.  General Appearance  Alert, conversant and in no acute stress.  Vascular  Dorsalis pedis and posterior tibial  pulses are palpable  bilaterally.  Capillary return is within normal limits  bilaterally. Temperature is within normal limits  bilaterally.  Neurologic  Senn-Weinstein monofilament wire test within normal limits  bilaterally. Muscle power within normal limits bilaterally.  Nails Thick disfigured discolored nails with subungual debris  hallux nails  toes bilaterally. No evidence of bacterial infection or drainage bilaterally.  Orthopedic  No limitations of motion  feet .  No crepitus or effusions noted.  No bony pathology or digital deformities noted.  Skin  normotropic skin with no porokeratosis noted bilaterally.  No signs of infections or ulcers noted.     Onychomycosis  Nails  B/L.  Pain in right toes  Pain in left toes  Debridement of nails both feet followed trimming the nails with dremel tool.    RTC 10 weeks    Helane Gunther DPM

## 2024-06-26 DIAGNOSIS — M75122 Complete rotator cuff tear or rupture of left shoulder, not specified as traumatic: Secondary | ICD-10-CM | POA: Diagnosis not present

## 2024-06-26 DIAGNOSIS — M25512 Pain in left shoulder: Secondary | ICD-10-CM | POA: Diagnosis not present

## 2024-07-06 DIAGNOSIS — T66XXXS Radiation sickness, unspecified, sequela: Secondary | ICD-10-CM | POA: Diagnosis not present

## 2024-07-06 DIAGNOSIS — C6932 Malignant neoplasm of left choroid: Secondary | ICD-10-CM | POA: Diagnosis not present

## 2024-07-06 DIAGNOSIS — H4312 Vitreous hemorrhage, left eye: Secondary | ICD-10-CM | POA: Diagnosis not present

## 2024-07-06 DIAGNOSIS — H3589 Other specified retinal disorders: Secondary | ICD-10-CM | POA: Diagnosis not present

## 2024-07-06 DIAGNOSIS — H43812 Vitreous degeneration, left eye: Secondary | ICD-10-CM | POA: Diagnosis not present

## 2024-07-06 DIAGNOSIS — H25813 Combined forms of age-related cataract, bilateral: Secondary | ICD-10-CM | POA: Diagnosis not present

## 2024-07-26 DIAGNOSIS — M75122 Complete rotator cuff tear or rupture of left shoulder, not specified as traumatic: Secondary | ICD-10-CM | POA: Diagnosis not present

## 2024-07-26 DIAGNOSIS — M25512 Pain in left shoulder: Secondary | ICD-10-CM | POA: Diagnosis not present

## 2024-07-30 DIAGNOSIS — L570 Actinic keratosis: Secondary | ICD-10-CM | POA: Diagnosis not present

## 2024-07-30 DIAGNOSIS — B078 Other viral warts: Secondary | ICD-10-CM | POA: Diagnosis not present

## 2024-07-30 DIAGNOSIS — X32XXXD Exposure to sunlight, subsequent encounter: Secondary | ICD-10-CM | POA: Diagnosis not present

## 2024-08-04 ENCOUNTER — Ambulatory Visit: Admitting: Podiatry

## 2024-08-11 ENCOUNTER — Other Ambulatory Visit: Payer: Self-pay | Admitting: Cardiovascular Disease

## 2024-08-11 DIAGNOSIS — I2510 Atherosclerotic heart disease of native coronary artery without angina pectoris: Secondary | ICD-10-CM

## 2024-08-11 DIAGNOSIS — I700 Atherosclerosis of aorta: Secondary | ICD-10-CM

## 2024-08-11 DIAGNOSIS — E7800 Pure hypercholesterolemia, unspecified: Secondary | ICD-10-CM

## 2024-08-19 DIAGNOSIS — J209 Acute bronchitis, unspecified: Secondary | ICD-10-CM | POA: Diagnosis not present

## 2024-08-19 DIAGNOSIS — J0140 Acute pansinusitis, unspecified: Secondary | ICD-10-CM | POA: Diagnosis not present

## 2024-08-19 DIAGNOSIS — R051 Acute cough: Secondary | ICD-10-CM | POA: Diagnosis not present

## 2024-08-26 DIAGNOSIS — W19XXXA Unspecified fall, initial encounter: Secondary | ICD-10-CM | POA: Diagnosis not present

## 2024-08-26 DIAGNOSIS — S50851A Superficial foreign body of right forearm, initial encounter: Secondary | ICD-10-CM | POA: Diagnosis not present

## 2024-08-26 DIAGNOSIS — M75122 Complete rotator cuff tear or rupture of left shoulder, not specified as traumatic: Secondary | ICD-10-CM | POA: Diagnosis not present

## 2024-08-26 DIAGNOSIS — M25512 Pain in left shoulder: Secondary | ICD-10-CM | POA: Diagnosis not present

## 2024-08-26 DIAGNOSIS — L03113 Cellulitis of right upper limb: Secondary | ICD-10-CM | POA: Diagnosis not present

## 2024-08-31 DIAGNOSIS — L03113 Cellulitis of right upper limb: Secondary | ICD-10-CM | POA: Diagnosis not present

## 2024-09-10 DIAGNOSIS — D485 Neoplasm of uncertain behavior of skin: Secondary | ICD-10-CM | POA: Diagnosis not present

## 2024-09-10 DIAGNOSIS — L089 Local infection of the skin and subcutaneous tissue, unspecified: Secondary | ICD-10-CM | POA: Diagnosis not present

## 2024-09-26 DIAGNOSIS — M25512 Pain in left shoulder: Secondary | ICD-10-CM | POA: Diagnosis not present

## 2024-09-26 DIAGNOSIS — M75122 Complete rotator cuff tear or rupture of left shoulder, not specified as traumatic: Secondary | ICD-10-CM | POA: Diagnosis not present

## 2024-09-29 DIAGNOSIS — C6932 Malignant neoplasm of left choroid: Secondary | ICD-10-CM | POA: Diagnosis not present

## 2024-10-05 ENCOUNTER — Telehealth: Payer: Self-pay | Admitting: Cardiovascular Disease

## 2024-10-05 DIAGNOSIS — I2510 Atherosclerotic heart disease of native coronary artery without angina pectoris: Secondary | ICD-10-CM

## 2024-10-05 DIAGNOSIS — E7800 Pure hypercholesterolemia, unspecified: Secondary | ICD-10-CM

## 2024-10-05 DIAGNOSIS — I700 Atherosclerosis of aorta: Secondary | ICD-10-CM

## 2024-10-05 MED ORDER — REPATHA SURECLICK 140 MG/ML ~~LOC~~ SOAJ: 140.0000 mg | SUBCUTANEOUS | 0 refills | Status: DC

## 2024-10-05 NOTE — Telephone Encounter (Signed)
 Reason for walk-in: Walk-in Reasons: medication refill Hi, patient would a refill for Repatha  that will last for 45m until she Dr. JAYSON in Jan.   If patient is requesting to be seen today, or if patient is having symptoms:  What symptoms are being reported (if any)?  N/a  Route to triage pool and ensure Teams message has been sent to the Triage [facility] chat.  3.   For medication samples, medication refills, HIM requests, appointment requests, lab-related requests, or form/record drop-off, please route to the appropriate pool.

## 2024-10-05 NOTE — Telephone Encounter (Signed)
 Called detail message that Repatha  Rx was e-sent to pharmacy Any question may call back

## 2024-10-26 DIAGNOSIS — M25512 Pain in left shoulder: Secondary | ICD-10-CM | POA: Diagnosis not present

## 2024-11-11 DIAGNOSIS — Z1283 Encounter for screening for malignant neoplasm of skin: Secondary | ICD-10-CM | POA: Diagnosis not present

## 2024-11-11 DIAGNOSIS — Z08 Encounter for follow-up examination after completed treatment for malignant neoplasm: Secondary | ICD-10-CM | POA: Diagnosis not present

## 2024-11-11 DIAGNOSIS — X32XXXD Exposure to sunlight, subsequent encounter: Secondary | ICD-10-CM | POA: Diagnosis not present

## 2024-11-11 DIAGNOSIS — Z85820 Personal history of malignant melanoma of skin: Secondary | ICD-10-CM | POA: Diagnosis not present

## 2024-11-11 DIAGNOSIS — D225 Melanocytic nevi of trunk: Secondary | ICD-10-CM | POA: Diagnosis not present

## 2024-11-11 DIAGNOSIS — L570 Actinic keratosis: Secondary | ICD-10-CM | POA: Diagnosis not present

## 2024-11-16 DIAGNOSIS — H18413 Arcus senilis, bilateral: Secondary | ICD-10-CM | POA: Diagnosis not present

## 2024-11-16 DIAGNOSIS — H25043 Posterior subcapsular polar age-related cataract, bilateral: Secondary | ICD-10-CM | POA: Diagnosis not present

## 2024-11-16 DIAGNOSIS — H2512 Age-related nuclear cataract, left eye: Secondary | ICD-10-CM | POA: Diagnosis not present

## 2024-11-16 DIAGNOSIS — H2513 Age-related nuclear cataract, bilateral: Secondary | ICD-10-CM | POA: Diagnosis not present

## 2024-11-16 DIAGNOSIS — H25013 Cortical age-related cataract, bilateral: Secondary | ICD-10-CM | POA: Diagnosis not present

## 2024-12-22 ENCOUNTER — Other Ambulatory Visit: Payer: Self-pay

## 2024-12-22 ENCOUNTER — Encounter (HOSPITAL_BASED_OUTPATIENT_CLINIC_OR_DEPARTMENT_OTHER): Payer: Self-pay | Admitting: Emergency Medicine

## 2024-12-22 ENCOUNTER — Emergency Department (HOSPITAL_BASED_OUTPATIENT_CLINIC_OR_DEPARTMENT_OTHER)
Admission: EM | Admit: 2024-12-22 | Discharge: 2024-12-23 | Disposition: A | Attending: Emergency Medicine | Admitting: Emergency Medicine

## 2024-12-22 DIAGNOSIS — Z7982 Long term (current) use of aspirin: Secondary | ICD-10-CM | POA: Diagnosis not present

## 2024-12-22 DIAGNOSIS — L03116 Cellulitis of left lower limb: Secondary | ICD-10-CM | POA: Insufficient documentation

## 2024-12-22 NOTE — ED Triage Notes (Signed)
 Pt reports wound to left lower leg from hitting her leg on an old television x 2 weeks ago, pt reports redness, warmth, and swelling to leg now, has been using an old RX for doxycyline to try to medicate at home

## 2024-12-23 MED ORDER — CEPHALEXIN 250 MG PO CAPS
500.0000 mg | ORAL_CAPSULE | Freq: Once | ORAL | Status: AC
  Administered 2024-12-23: 500 mg via ORAL
  Filled 2024-12-23: qty 2

## 2024-12-23 MED ORDER — SULFAMETHOXAZOLE-TRIMETHOPRIM 800-160 MG PO TABS
1.0000 | ORAL_TABLET | Freq: Once | ORAL | Status: AC
  Administered 2024-12-23: 1 via ORAL
  Filled 2024-12-23: qty 1

## 2024-12-23 MED ORDER — CEPHALEXIN 500 MG PO CAPS: 500.0000 mg | ORAL_CAPSULE | Freq: Three times a day (TID) | ORAL | 0 refills | Status: AC

## 2024-12-23 MED ORDER — SULFAMETHOXAZOLE-TRIMETHOPRIM 800-160 MG PO TABS: 1.0000 | ORAL_TABLET | Freq: Two times a day (BID) | ORAL | 0 refills | Status: AC

## 2024-12-23 NOTE — ED Provider Notes (Signed)
 "  [place] EMERGENCY DEPARTMENT AT [facility]  Provider Note  CSN: [id number] Arrival date & time: 12/22/24 2259  History Chief Complaint  Patient presents with   Wound Check    Olivia Espinoza is a 83 y.o. female reports she bumped her L leg on a TV 2 weeks ago sustaining a small abrasion, she has had increased redness and swelling since then. No drainage. No fever. She has been taking doxycycline left over from prior cellulitis on R arm a few months ago without much improvement.   Home Medications Prior to Admission medications  Medication Sig Start Date End Date Taking? Authorizing Provider  cephALEXin  (KEFLEX ) 500 MG capsule Take 1 capsule (500 mg total) by mouth 3 (three) times daily for 7 days. 12/23/24 12/30/24 Yes Roselyn Carlin NOVAK, MD  sulfamethoxazole -trimethoprim  (BACTRIM  DS) 800-160 MG tablet Take 1 tablet by mouth 2 (two) times daily for 7 days. 12/23/24 12/30/24 Yes Roselyn Carlin NOVAK, MD  amLODipine (NORVASC) 5 MG tablet Take 5 mg by mouth daily in the afternoon. 07/27/21   [provider]  aspirin  EC 81 MG tablet Take 81 mg by mouth every 6 (six) hours as needed for moderate pain.    [provider]  clobetasol cream (TEMOVATE) 0.05 % Apply 1 Application topically daily as needed (irritation). 09/04/23   [provider]  Evolocumab  (REPATHA  SURECLICK) 140 MG/ML SOAJ Inject 140 mg into the skin every 14 (fourteen) days. Please keep appointment in Jan 2026 to receive further refills 10/05/24   Croitoru, Jerel, MD  levothyroxine  (SYNTHROID ) 75 MCG tablet Take 75 mcg by mouth daily before breakfast. 07/01/14   [provider]  Multiple Vitamin (MULTIVITAMIN WITH MINERALS) TABS tablet Take 1 tablet by mouth daily.    [provider]  Povidone, PF, (IVIZIA DRY EYES) 0.5 % SOLN Place 1 drop into both eyes as needed (dry eyes).    [provider]  triamcinolone (NASACORT ALLERGY 24HR) 55 MCG/ACT AERO nasal inhaler Place 2  sprays into the nose daily as needed (allergies).    [provider]     Allergies    Chlorhexidine  gluconate, Levofloxacin, Motrin [ibuprofen], Rosuvastatin, Ciprofloxacin, Ezetimibe, Ceclor [cefaclor], and Lisinopril    Review of Systems   Review of Systems Please see HPI for pertinent positives and negatives  Physical Exam BP (!) 185/90 (BP Location: Right Arm)   Pulse 91   Temp 98 F (36.7 C) (Oral)   Resp 17   Ht 5' 1 (1.549 m)   Wt 59 kg   SpO2 97%   BMI 24.56 kg/m   Physical Exam Vitals and nursing note reviewed.  HENT:     Head: Normocephalic.     Nose: Nose normal.  Eyes:     Extraocular Movements: Extraocular movements intact.  Pulmonary:     Effort: Pulmonary effort is normal.  Musculoskeletal:        General: Swelling present. No tenderness. Normal range of motion.     Cervical back: Neck supple.     Comments: Erythema, warmth, induration, no fluctuance  Skin:    Findings: No rash (on exposed skin).  Neurological:     Mental Status: She is alert and oriented to person, place, and time.  Psychiatric:        Mood and Affect: Mood normal.     ED Results / Procedures / Treatments   EKG None  Procedures Procedures  Medications Ordered in the ED Medications  sulfamethoxazole -trimethoprim  (BACTRIM  DS) 800-160 MG per tablet 1 tablet (  has no administration in time range)  cephALEXin  (KEFLEX ) capsule 500 mg (has no administration in time range)    Initial Impression and Plan  Patient here with cellulitis of LLE not improving with appropriate dose of doxycycline for several days. No signs of abscess or systemic infection. Will switch to Bactrim /Keflex  combo. She has allergy listed to Ceclor, but reports she has tolerated Keflex  in the past. PCP follow up, RTED for any other concerns.    ED Course       MDM Rules/Calculators/A&P Medical Decision Making Problems Addressed: Cellulitis of left leg: acute illness or  injury  Risk Prescription drug management.     Final Clinical Impression(s) / ED Diagnoses Final diagnoses:  Cellulitis of left leg    Rx / DC Orders ED Discharge Orders          Ordered    cephALEXin  (KEFLEX ) 500 MG capsule  3 times daily        12/23/24 0022    sulfamethoxazole -trimethoprim  (BACTRIM  DS) 800-160 MG tablet  2 times daily        12/23/24 0022             Roselyn Carlin NOVAK, MD 12/23/24 0022  "

## 2025-01-04 ENCOUNTER — Other Ambulatory Visit (HOSPITAL_COMMUNITY): Payer: Self-pay

## 2025-01-04 ENCOUNTER — Encounter: Payer: Self-pay | Admitting: Cardiovascular Disease

## 2025-01-04 ENCOUNTER — Ambulatory Visit: Attending: Cardiovascular Disease | Admitting: Cardiovascular Disease

## 2025-01-04 ENCOUNTER — Telehealth: Payer: Self-pay | Admitting: Pharmacy Technician

## 2025-01-04 VITALS — BP 158/70 | HR 72 | Ht 61.0 in | Wt 126.8 lb

## 2025-01-04 DIAGNOSIS — E7800 Pure hypercholesterolemia, unspecified: Secondary | ICD-10-CM | POA: Diagnosis not present

## 2025-01-04 DIAGNOSIS — G720 Drug-induced myopathy: Secondary | ICD-10-CM

## 2025-01-04 DIAGNOSIS — I2510 Atherosclerotic heart disease of native coronary artery without angina pectoris: Secondary | ICD-10-CM

## 2025-01-04 DIAGNOSIS — I700 Atherosclerosis of aorta: Secondary | ICD-10-CM

## 2025-01-04 DIAGNOSIS — T466X5A Adverse effect of antihyperlipidemic and antiarteriosclerotic drugs, initial encounter: Secondary | ICD-10-CM

## 2025-01-04 DIAGNOSIS — I10 Essential (primary) hypertension: Secondary | ICD-10-CM

## 2025-01-04 MED ORDER — REPATHA SURECLICK 140 MG/ML ~~LOC~~ SOAJ: 140.0000 mg | SUBCUTANEOUS | 3 refills | Status: AC

## 2025-01-04 NOTE — Telephone Encounter (Signed)
 Patient Advocate Encounter   The patient was approved for a [employer] grant that will help cover the cost of Repatha  Total amount awarded, 2500.00.  Effective: 02/01/25 - 01/31/26   APW:[id number] ERW:[id number] Hmnle:[id number] PI:[id number]  [employer] ID: [id number]   Pharmacy provided with approval and processing information.

## 2025-01-04 NOTE — Patient Instructions (Signed)
 Medication Instructions:  Your physician recommends that you continue on your current medications as directed. Please refer to the Current Medication list given to you today.   *If you need a refill on your cardiac medications before your next appointment, please call your pharmacy*  Lab Work: Have lab work (lipid profile) drawn in the lab on the first floor today If you have labs (blood work) drawn today and your tests are completely normal, you will receive your results only by: MyChart Message (if you have MyChart) OR A paper copy in the mail If you have any lab test that is abnormal or we need to change your treatment, we will call you to review the results.  Testing/Procedures: none  Follow-Up: At [facility], you and your health needs are our priority.  As part of our continuing mission to provide you with exceptional heart care, our providers are all part of one team.  This team includes your primary Cardiologist (physician) and Advanced Practice Providers or APPs (Physician Assistants and Nurse Practitioners) who all work together to provide you with the care you need, when you need it.  Your next appointment:   12 month(s)  Provider:   Jerel Balding, MD    We recommend signing up for the patient portal called MyChart.  Sign up information is provided on this After Visit Summary.  MyChart is used to connect with patients for Virtual Visits (Telemedicine).  Patients are able to view lab/test results, encounter notes, upcoming appointments, etc.  Non-urgent messages can be sent to your provider as well.   To learn more about what you can do with MyChart, go to [email or web address].   Other Instructions

## 2025-01-04 NOTE — Progress Notes (Signed)
 " Cardiology Office Note:    Date:  01/09/2025   ID:  Olivia Espinoza, DOB [birth year], MRN [id number]  PCP:  Olivia Motto, DO Espinoza Dayna, MD   [facility] Providers Cardiologist:  Jerel Balding, MD     Referring MD: Olivia Motto, DO   Chief Complaint  Patient presents with   Coronary Artery Disease     History of Present Illness:    Olivia Espinoza is a 84 y.o. female with a hx of hypercholesterolemia, hypertension, treated hypothyroidism and a strong family history of coronary disease, who has CAD-equivalent elevated coronary calcium score at 541 (85th percentile).  Coronary CT angiography 01/04/2022 did not show any meaningful coronary stenoses.  She had an episode of cellulitis in her left lower extremity just before Christmas from which she has recovered well.  She has not had any cardiovascular complaints.  She has chronic musculoskeletal issues, especially in her left shoulder, but has recovered to the point that she does not think she needs surgery.  The patient specifically denies any chest pain at rest or with exertion, dyspnea at rest or with exertion, orthopnea, paroxysmal nocturnal dyspnea, syncope, palpitations, focal neurological deficits, intermittent claudication, lower extremity edema, unexplained weight gain, cough, hemoptysis or wheezing.  Blood pressure is a little high today.  Normal when checked at home, although she has slacked off on checking it recently.  Problems with a rash on lisinopril  and fatigue on valsartan and her blood pressure has been controlled with amlodipine, which may be contributing to her occasional mild lower extremity swelling.      She has a history of statin myopathy (rosuvastatin and simvastatin) and also did not tolerate ezetimibe.  On Repatha  she has an excellent lipid profile, but cost remains an issue.  She has a very high calcium score but no significant coronary stenoses on CT angiography.  Target LDL less than 70.  Most recent  LDL was 66 (on labs today LDL cholesterol 73).  Her father had a myocardial infarction at age 62, her brother had bypass surgery at age 66.  Another brother who was always very healthy, a triathlete and very lean, ended up having bypass surgery at age 18.     She had a normal nuclear stress test in 2018.  This was performed because a chest CT described aortic atherosclerosis and coronary artery calcifications.  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 Kerrin in 2017.  Untreated lipid profile showed total cholesterol 232 and an LDL cholesterol of 149.  Her HDL is always good in the high 50s-60 range.  She has normal triglycerides.  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   levothyroxine  (SYNTHROID ) 75 MCG tablet Take 75 mcg by mouth daily before breakfast.   [DISCONTINUED] Evolocumab  (REPATHA  SURECLICK) 140 MG/ML SOAJ Inject 140 mg into the skin every 14 (fourteen) days. Please keep appointment in Jan 2026 to receive further refills     Allergies:   Chlorhexidine  gluconate, Chlorhexidine  gluconate, Ezetimibe, Ibuprofen, Levofloxacin, Losartan potassium-hctz,  Rosuvastatin, Valsartan-hydrochlorothiazide, Ciprofloxacin, Simvastatin, Cefaclor, Erythromycin, and Lisinopril   Family History: The patient's family history includes CVA in her father; Heart attack in her father.  Her father's heart attack occurred at age 78.  2 of her brothers had bypass surgery, 1 at age 46, the other at age 53  ROS:   Please see the history of present illness.     All other systems reviewed and are negative.  EKGs/Labs/Other Studies Reviewed:     EKG Interpretation Date/Time:  Tuesday January 04 2025 10:34:35 EST Ventricular Rate:  72 PR Interval:  176 QRS Duration:  66 QT Interval:  394 QTC Calculation: 431 R Axis:   -33  Text Interpretation: Sinus rhythm with Premature atrial complexes Left axis deviation Septal infarct (cited on or before 04-Jan-2025) When compared with ECG of 07-Apr-2024 10:29, Premature atrial complexes are now Present QRS axis Shifted left Confirmed by Raygen Dahm [phone]) on 01/04/2025 10:50:10 AM         The following studies were reviewed today: Lexiscan  Myoview  01/10/2017  Nuclear stress EF: 78%. The study is normal. This is a low risk study. The left ventricular ejection fraction is hyperdynamic (>65%). There was no ST segment deviation noted during stress.   Extracardiac activity obscures the mid and basal inferior wall No other infarct/ischemia EF normal 78%  Low risk study   CT chest June 09, 2018 1. Postoperative changes in the right supraclavicular region appear similar to prior examinations related to resected schwannoma. No definite findings to suggest local recurrence. 2. No acute findings in the thorax. 3. Aortic atherosclerosis, in addition to three-vessel coronary artery disease. Assessment for potential risk factor modification, dietary therapy or pharmacologic therapy may be warranted, if clinically indicated.   Coronary CTA 01/03/2022 1. Coronary artery calcium score 541 Agatston units. This places the  patient in the 83rd percentile for age and gender, suggesting high risk for future cardiac events. 2.  Nonobstructive coronary disease.  EKG:  EKG is  ordered today.  Shows normal sinus rhythm with somewhat low voltage throughout, QS pattern in leads V1-V2, QTc 453 ms.  Previous ECG showed sinus rhythm with a single PVC and borderline voltage criteria for LVH with nonspecific repolarization abnormalities (< 1mm ST segment depression in the inferior leads).  Borderline left axis deviation.  QTc 423 ms.   The difference and seem to be related to lead placement.  Recent Labs: No results found for requested labs within last 365 days.  01/23/2022 Creatinine 1.02, potassium 4.7, ALT 19  01/13/2024  hemoglobin 13.7, potassium 4.2, creatinine 0.92  Recent Lipid Panel    Component Value Date/Time   CHOL 177 01/04/2025 1158   TRIG 139 01/04/2025 1158   HDL 81 01/04/2025 1158   CHOLHDL 2.2 01/04/2025 1158   CHOLHDL 4.1 03/25/2011 1250   VLDL 27 03/25/2011 1250   LDLCALC 73 01/04/2025 1158   12/05/2021 Cholesterol 217, HDL 60, LDL 137, triglycerides 110 Creatinine 0.94, potassium 4.8, ALT 14, TSH 1.77  01/29/2023 Cholesterol 164, HDL 76, LDL 63, triglycerides 144 Creatinine 0.96, potassium 4.8, ALT  17, TSH 0.81  10/09/2023 Cholesterol 152, HDL 73, LDL 66, triglycerides 66  Risk Assessment/Calculations:       HYPERTENSION CONTROL Vitals:   01/04/25 1036 01/09/25 1053 01/09/25 1054  BP: (!) 158/70 (!) 151/69 (!) 158/70    The patient's blood pressure is elevated above target today.  In order to address the patient's elevated BP: The blood pressure is usually elevated in clinic.  Blood pressures monitored at home have been optimal.      Physical Exam:    VS:  BP (!) 158/70   Pulse 72   Ht 5' 1 (1.549 m)   Wt 126 lb 12.8 oz (57.5 kg)   SpO2 96%   BMI 23.96 kg/m     Wt Readings from Last 3 Encounters:  01/04/25 126 lb 12.8 oz (57.5 kg)  12/22/24 130 lb (59 kg)  04/07/24  123 lb (55.8 kg)      General: Alert, oriented x3, no distress, lean and fit Head: no evidence of trauma, PERRL, EOMI, no exophtalmos or lid lag, no myxedema, no xanthelasma; normal ears, nose and oropharynx Neck: normal jugular venous pulsations and no hepatojugular reflux; brisk carotid pulses without delay and no carotid bruits Chest: clear to auscultation, no signs of consolidation by percussion or palpation, normal fremitus, symmetrical and full respiratory excursions Cardiovascular: normal position and quality of the apical impulse, regular rhythm, normal first and second heart sounds, no murmurs, rubs or gallops Abdomen: no tenderness or distention, no masses by palpation, no abnormal pulsatility or arterial bruits, normal bowel sounds, no hepatosplenomegaly Extremities: no clubbing, cyanosis or edema; 2+ radial, ulnar and brachial pulses bilaterally; 2+ right femoral, posterior tibial and dorsalis pedis pulses; 2+ left femoral, posterior tibial and dorsalis pedis pulses; no subclavian or femoral bruits Neurological: grossly nonfocal Psych: Normal mood and affect    ASSESSMENT:    1. Atherosclerosis of native coronary artery of native heart without angina pectoris   2. Aortic atherosclerosis   3. Hypercholesterolemia   4. Essential hypertension   5. Statin myopathy      PLAN:    In order of problems listed above:  Coronary and aortic atherosclerosis: Remains asymptomatic despite a very active lifestyle..  Elevated coronary calcium score but no significant obstruction on CT angiography in 2023.  ECG consistently reports a septal infarct age undetermined but there is no evidence of previous myocardial infarction on additional workup. HTN: Well-controlled at home and often elevated in doctors office.  Asked her to keep a log of it over the next week and call if it is greater than 140/90. HLP: Myopathy with 2 different statins in the past.  Excellent response to Repatha .  Need to  continue this.  Will try to get funding assistance this year as well.       Medication Adjustments/Labs and Tests Ordered: Current medicines are reviewed at length with the patient today.  Concerns regarding medicines are outlined above.  Orders Placed This Encounter  Procedures   Lipid Profile   EKG 12-Lead   Meds ordered this encounter  Medications   Evolocumab  (REPATHA  SURECLICK) 140 MG/ML SOAJ    Sig: Inject 140 mg into the skin every 14 (fourteen) days.    Dispense:  6 mL    Refill:  3    Patient Instructions  Medication Instructions:  Your physician recommends that you continue on your current medications as directed. Please refer to the Current Medication list given to you today.   *If you need a refill on  your cardiac medications before your next appointment, please call your pharmacy*  Lab Work: Have lab work (lipid profile) drawn in the lab on the first floor today If you have labs (blood work) drawn today and your tests are completely normal, you will receive your results only by: MyChart Message (if you have MyChart) OR A paper copy in the mail If you have any lab test that is abnormal or we need to change your treatment, we will call you to review the results.  Testing/Procedures: none  Follow-Up: At [facility], you and your health needs are our priority.  As part of our continuing mission to provide you with exceptional heart care, our providers are all part of one team.  This team includes your primary Cardiologist (physician) and Advanced Practice Providers or APPs (Physician Assistants and Nurse Practitioners) who all work together to provide you with the care you need, when you need it.  Your next appointment:   12 month(s)  Provider:   Jerel Balding, MD    We recommend signing up for the patient portal called MyChart.  Sign up information is provided on this After Visit Summary.  MyChart is used to connect with patients for Virtual Visits  (Telemedicine).  Patients are able to view lab/test results, encounter notes, upcoming appointments, etc.  Non-urgent messages can be sent to your provider as well.   To learn more about what you can do with MyChart, go to [email or web address].   Other Instructions            Signed, Jerel Balding, MD  01/09/2025 10:57 AM    [facility]    "

## 2025-01-05 ENCOUNTER — Ambulatory Visit: Payer: Self-pay | Admitting: Cardiovascular Disease

## 2025-01-05 LAB — LIPID PANEL
Chol/HDL Ratio: 2.2 ratio (ref 0.0–4.4)
Cholesterol, Total: 177 mg/dL (ref 100–199)
HDL: 81 mg/dL
LDL Chol Calc (NIH): 73 mg/dL (ref 0–99)
Triglycerides: 139 mg/dL (ref 0–149)
VLDL Cholesterol Cal: 23 mg/dL (ref 5–40)

## 2025-01-09 ENCOUNTER — Encounter: Payer: Self-pay | Admitting: Cardiovascular Disease

## 2025-01-10 ENCOUNTER — Ambulatory Visit: Admitting: Podiatry

## 2025-01-10 DIAGNOSIS — L600 Ingrowing nail: Secondary | ICD-10-CM | POA: Diagnosis not present

## 2025-01-10 NOTE — Progress Notes (Signed)
 Subjective: Chief Complaint  Patient presents with   Ingrown Toenail    Patient presents today for an ingrown toenail in her left foot , reports tenderness and discomfort.   84 year old female presents the office with above concerns.  She said that she is having tenderness on the left big toe distal aspect going on for some time but denies any drainage or pus.  She states that she over the holidays was eating increased salt and she had swelling in the tissues may have rubbed.  No other injuries that she reports.  Objective: AAO x3, NAD DP/PT pulses palpable bilaterally, CRT less than 3 seconds Incurvation present to left lateral hallux toenail with tenderness palpation of the distal portion of the nail.  There is tenderness prior to debridement after debridement there is no further tenderness.  Is no drainage or pus.  Localized edema erythema more from inflammation as opposed to infection.  No ascending cellulitis. No pain with calf compression, swelling, warmth, erythema  Assessment: Ingrown toenail left hallux  Plan: -All treatment options discussed with the patient including all alternatives, risks, complications.  -Discussed partial nail avulsion versus conservative debridement.  She was proceed with debridement today and if symptoms persist consider partial nail avulsion with chemical exercise me.  I debrided the corn today any complications or bleeding.  After debridement there is no further discomfort noted.  Antibiotic ointment is applied followed by dressing.  Discussed Epsom salt soaks for couple of days.  Monitor any signs or symptoms of infection and or reoccurrence of symptoms were to persist or recur then proceed with partial nail avulsion. -Patient encouraged to call the office with any questions, concerns, change in symptoms.   Donnice JONELLE Fees DPM

## 2025-01-10 NOTE — Patient Instructions (Signed)
 Soak Instructions    THE DAY AFTER THE PROCEDURE  Place 1/4 cup of epsom salts in a quart of warm tap water.  Submerge your foot or feet with outer bandage intact for the initial soak; this will allow the bandage to become moist and wet for easy lift off.  Once you remove your bandage, continue to soak in the solution for 20 minutes.  This soak should be done twice a day.  Next, remove your foot or feet from solution, blot dry the affected area and cover.  You may use a band aid large enough to cover the area or use gauze and tape.  Apply other medications to the area as directed by the doctor such as polysporin neosporin.  IF YOUR SKIN BECOMES IRRITATED WHILE USING THESE INSTRUCTIONS, IT IS OKAY TO SWITCH TO  WHITE VINEGAR AND WATER. Or you may use antibacterial soap and water to keep the toe clean  Monitor for any signs/symptoms of infection. Call the office immediately if any occur or go directly to the emergency room. Call with any questions/concerns.  --   Ingrown Toenail  An ingrown toenail occurs when the corner or sides of a toenail grow into the surrounding skin. This causes discomfort and pain. The big toe is most commonly affected, but any of the toes can be affected. If an ingrown toenail is not treated, it can become infected. What are the causes? This condition may be caused by: Wearing shoes that are too small or tight. An injury, such as stubbing your toe or having your toe stepped on. Improper cutting or care of your toenails. Having nail or foot abnormalities that were present from birth (congenital abnormalities), such as having a nail that is too big for your toe. What increases the risk? The following factors may make you more likely to develop ingrown toenails: Age. Nails tend to get thicker with age, so ingrown nails are more common among older people. Cutting your toenails incorrectly, such as cutting them very short or cutting them unevenly. An ingrown toenail is  more likely to get infected if you have: Diabetes. Blood flow (circulation) problems. What are the signs or symptoms? Symptoms of an ingrown toenail may include: Pain, soreness, or tenderness. Redness. Swelling. Hardening of the skin that surrounds the toenail. Signs that an ingrown toenail may be infected include: Fluid or pus. Symptoms that get worse. How is this diagnosed? Ingrown toenails may be diagnosed based on: Your symptoms and medical history. A physical exam. Labs or tests. If you have fluid or blood coming from your toenail, a sample may be collected to test for the specific type of bacteria that is causing the infection. How is this treated? Treatment depends on the severity of your symptoms. You may be able to care for your toenail at home. If you have an infection, you may be prescribed antibiotic medicines. If you have fluid or pus draining from your toenail, your health care provider may drain it. If you have trouble walking, you may be given crutches to use. If you have a severe or infected ingrown toenail, you may need a procedure to remove part or all of the nail. Follow these instructions at home: Foot care  Check your wound every day for signs of infection, or as often as told by your health care provider. Check for: More redness, swelling, or pain. More fluid or blood. Warmth. Pus or a bad smell. Do not pick at your toenail or try to remove it  yourself. Soak your foot in warm, soapy water. Do this for 20 minutes, 3 times a day, or as often as told by your health care provider. This helps to keep your toe clean and your skin soft. Wear shoes that fit well and are not too tight. Your health care provider may recommend that you wear open-toed shoes while you heal. Trim your toenails regularly and carefully. Cut your toenails straight across to prevent injury to the skin at the corners of the toenail. Do not cut your nails in a curved shape. Keep your feet clean  and dry to help prevent infection. General instructions Take over-the-counter and prescription medicines only as told by your health care provider. If you were prescribed an antibiotic, take it as told by your health care provider. Do not stop taking the antibiotic even if you start to feel better. If your health care provider told you to use crutches to help you move around, use them as instructed. Return to your normal activities as told by your health care provider. Ask your health care provider what activities are safe for you. Keep all follow-up visits. This is important. Contact a health care provider if: You have more redness, swelling, pain, or other symptoms that do not improve with treatment. You have fluid, blood, or pus coming from your toenail. You have a red streak on your skin that starts at your foot and spreads up your leg. You have a fever. Summary An ingrown toenail occurs when the corner or sides of a toenail grow into the surrounding skin. This causes discomfort and pain. The big toe is most commonly affected, but any of the toes can be affected. If an ingrown toenail is not treated, it can become infected. Fluid or pus draining from your toenail is a sign of infection. Your health care provider may need to drain it. You may be given antibiotics to treat the infection. Trimming your toenails regularly and properly can help you prevent an ingrown toenail. This information is not intended to replace advice given to you by your health care provider. Make sure you discuss any questions you have with your health care provider. Document Revised: 04/17/2021 Document Reviewed: 04/17/2021 Elsevier Patient Education  2024 [employer].

## 2025-01-12 ENCOUNTER — Telehealth: Payer: Self-pay | Admitting: Podiatry

## 2025-01-12 NOTE — Telephone Encounter (Signed)
 Thank you for the update!

## 2025-01-12 NOTE — Telephone Encounter (Signed)
 The patient was seen on January 10, 2025, and paid a $40 co-pay in cash. The front desk agent was unable to provide a printed receipt at the time of payment.  Due to a previous experience in which a cash payment was not properly applied, the patient is concerned about ensuring this payment has been correctly posted to her account. She is requesting confirmation that the $40 payment was received and applied.  Additionally, the patient would like to speak with management to discuss front desk training related to cash payments and receipt issuance to help prevent similar issues in the future.
